# Patient Record
Sex: Female | Born: 1980 | Race: Black or African American | Hispanic: No | Marital: Married | Smoking: Never smoker
Health system: Southern US, Community
[De-identification: ages and names within clinical notes are randomized; demographics above are authoritative.]

## PROBLEM LIST (undated history)

## (undated) DIAGNOSIS — D649 Anemia, unspecified: Secondary | ICD-10-CM

## (undated) DIAGNOSIS — R112 Nausea with vomiting, unspecified: Secondary | ICD-10-CM

## (undated) DIAGNOSIS — D573 Sickle-cell trait: Secondary | ICD-10-CM

## (undated) DIAGNOSIS — N809 Endometriosis, unspecified: Secondary | ICD-10-CM

## (undated) DIAGNOSIS — T8859XA Other complications of anesthesia, initial encounter: Secondary | ICD-10-CM

## (undated) DIAGNOSIS — I10 Essential (primary) hypertension: Secondary | ICD-10-CM

## (undated) DIAGNOSIS — Z9889 Other specified postprocedural states: Secondary | ICD-10-CM

## (undated) HISTORY — PX: ABDOMINAL HYSTERECTOMY: SHX81

## (undated) HISTORY — PX: HERNIA REPAIR: SHX51

## (undated) HISTORY — PX: AUGMENTATION MAMMAPLASTY: SUR837

---

## 1998-11-13 ENCOUNTER — Other Ambulatory Visit: Admission: RE | Admit: 1998-11-13 | Discharge: 1998-11-13 | Payer: Self-pay | Admitting: *Deleted

## 1999-06-03 ENCOUNTER — Other Ambulatory Visit: Admission: RE | Admit: 1999-06-03 | Discharge: 1999-06-03 | Payer: Self-pay | Admitting: Emergency Medicine

## 1999-08-20 ENCOUNTER — Emergency Department (HOSPITAL_COMMUNITY): Admission: EM | Admit: 1999-08-20 | Discharge: 1999-08-20 | Payer: Self-pay | Admitting: Emergency Medicine

## 1999-08-20 ENCOUNTER — Encounter: Payer: Self-pay | Admitting: Emergency Medicine

## 1999-09-19 ENCOUNTER — Other Ambulatory Visit: Admission: RE | Admit: 1999-09-19 | Discharge: 1999-09-19 | Payer: Self-pay | Admitting: Gynecology

## 1999-11-13 ENCOUNTER — Other Ambulatory Visit: Admission: RE | Admit: 1999-11-13 | Discharge: 1999-11-13 | Payer: Self-pay | Admitting: Gynecology

## 2000-05-24 ENCOUNTER — Other Ambulatory Visit: Admission: RE | Admit: 2000-05-24 | Discharge: 2000-05-24 | Payer: Self-pay | Admitting: Gynecology

## 2000-11-11 ENCOUNTER — Other Ambulatory Visit: Admission: RE | Admit: 2000-11-11 | Discharge: 2000-11-11 | Payer: Self-pay | Admitting: Gynecology

## 2001-06-14 ENCOUNTER — Other Ambulatory Visit: Admission: RE | Admit: 2001-06-14 | Discharge: 2001-06-14 | Payer: Self-pay | Admitting: Gynecology

## 2002-08-28 ENCOUNTER — Other Ambulatory Visit: Admission: RE | Admit: 2002-08-28 | Discharge: 2002-08-28 | Payer: Self-pay | Admitting: Obstetrics and Gynecology

## 2003-11-09 ENCOUNTER — Emergency Department (HOSPITAL_COMMUNITY): Admission: EM | Admit: 2003-11-09 | Discharge: 2003-11-09 | Payer: Self-pay | Admitting: Family Medicine

## 2003-11-16 ENCOUNTER — Inpatient Hospital Stay (HOSPITAL_COMMUNITY): Admission: AD | Admit: 2003-11-16 | Discharge: 2003-11-16 | Payer: Self-pay | Admitting: Obstetrics & Gynecology

## 2004-02-19 ENCOUNTER — Other Ambulatory Visit: Admission: RE | Admit: 2004-02-19 | Discharge: 2004-02-19 | Payer: Self-pay | Admitting: Obstetrics and Gynecology

## 2005-07-13 ENCOUNTER — Other Ambulatory Visit: Admission: RE | Admit: 2005-07-13 | Discharge: 2005-07-13 | Payer: Self-pay | Admitting: Obstetrics & Gynecology

## 2006-03-07 ENCOUNTER — Emergency Department (HOSPITAL_COMMUNITY): Admission: EM | Admit: 2006-03-07 | Discharge: 2006-03-07 | Payer: Self-pay | Admitting: Family Medicine

## 2006-06-07 ENCOUNTER — Other Ambulatory Visit: Admission: RE | Admit: 2006-06-07 | Discharge: 2006-06-07 | Payer: Self-pay | Admitting: Obstetrics and Gynecology

## 2008-02-08 ENCOUNTER — Encounter: Admission: RE | Admit: 2008-02-08 | Discharge: 2008-02-08 | Payer: Self-pay | Admitting: Family Medicine

## 2008-07-08 ENCOUNTER — Inpatient Hospital Stay (HOSPITAL_COMMUNITY): Admission: AD | Admit: 2008-07-08 | Discharge: 2008-07-08 | Payer: Self-pay | Admitting: Obstetrics & Gynecology

## 2008-10-12 ENCOUNTER — Inpatient Hospital Stay (HOSPITAL_COMMUNITY): Admission: AD | Admit: 2008-10-12 | Discharge: 2008-10-13 | Payer: Self-pay | Admitting: Obstetrics and Gynecology

## 2009-01-27 ENCOUNTER — Emergency Department (HOSPITAL_COMMUNITY): Admission: EM | Admit: 2009-01-27 | Discharge: 2009-01-27 | Payer: Self-pay | Admitting: Emergency Medicine

## 2009-02-08 ENCOUNTER — Inpatient Hospital Stay (HOSPITAL_COMMUNITY): Admission: RE | Admit: 2009-02-08 | Discharge: 2009-02-11 | Payer: Self-pay | Admitting: Obstetrics and Gynecology

## 2009-05-04 HISTORY — PX: AUGMENTATION MAMMAPLASTY: SUR837

## 2009-07-15 ENCOUNTER — Encounter: Admission: RE | Admit: 2009-07-15 | Discharge: 2009-07-22 | Payer: Self-pay | Admitting: *Deleted

## 2009-07-31 ENCOUNTER — Emergency Department (HOSPITAL_COMMUNITY): Admission: EM | Admit: 2009-07-31 | Discharge: 2009-08-01 | Payer: Self-pay | Admitting: Emergency Medicine

## 2009-08-01 ENCOUNTER — Ambulatory Visit (HOSPITAL_COMMUNITY): Admission: RE | Admit: 2009-08-01 | Discharge: 2009-08-01 | Payer: Self-pay | Admitting: Emergency Medicine

## 2009-08-01 ENCOUNTER — Ambulatory Visit: Payer: Self-pay | Admitting: Surgery

## 2009-08-01 ENCOUNTER — Encounter (INDEPENDENT_AMBULATORY_CARE_PROVIDER_SITE_OTHER): Payer: Self-pay | Admitting: Emergency Medicine

## 2010-02-02 ENCOUNTER — Emergency Department (HOSPITAL_COMMUNITY)
Admission: EM | Admit: 2010-02-02 | Discharge: 2010-02-02 | Payer: Self-pay | Source: Home / Self Care | Admitting: Family Medicine

## 2010-04-01 ENCOUNTER — Encounter: Admission: RE | Admit: 2010-04-01 | Discharge: 2010-04-01 | Payer: Self-pay | Admitting: Family Medicine

## 2010-04-03 HISTORY — PX: OTHER SURGICAL HISTORY: SHX169

## 2010-05-05 ENCOUNTER — Ambulatory Visit (HOSPITAL_BASED_OUTPATIENT_CLINIC_OR_DEPARTMENT_OTHER): Admission: RE | Admit: 2010-05-05 | Payer: Self-pay | Admitting: Specialist

## 2010-07-27 LAB — CBC
HCT: 41.7 % (ref 36.0–46.0)
Hemoglobin: 14.1 g/dL (ref 12.0–15.0)
MCHC: 33.8 g/dL (ref 30.0–36.0)
MCV: 86.7 fL (ref 78.0–100.0)
Platelets: 260 10*3/uL (ref 150–400)
RBC: 4.81 MIL/uL (ref 3.87–5.11)
RDW: 12.5 % (ref 11.5–15.5)
WBC: 6.8 10*3/uL (ref 4.0–10.5)

## 2010-07-27 LAB — URINALYSIS, ROUTINE W REFLEX MICROSCOPIC
Bilirubin Urine: NEGATIVE
Hgb urine dipstick: NEGATIVE
Nitrite: NEGATIVE
Specific Gravity, Urine: 1.018 (ref 1.005–1.030)
pH: 6 (ref 5.0–8.0)

## 2010-07-27 LAB — POCT I-STAT, CHEM 8
Calcium, Ion: 1.23 mmol/L (ref 1.12–1.32)
Creatinine, Ser: 0.9 mg/dL (ref 0.4–1.2)
Glucose, Bld: 94 mg/dL (ref 70–99)
HCT: 45 % (ref 36.0–46.0)
Hemoglobin: 15.3 g/dL — ABNORMAL HIGH (ref 12.0–15.0)

## 2010-07-27 LAB — PROTIME-INR
INR: 0.97 (ref 0.00–1.49)
Prothrombin Time: 12.8 seconds (ref 11.6–15.2)

## 2010-07-27 LAB — DIFFERENTIAL
Basophils Relative: 1 % (ref 0–1)
Eosinophils Absolute: 0.1 10*3/uL (ref 0.0–0.7)
Neutrophils Relative %: 41 % — ABNORMAL LOW (ref 43–77)

## 2010-07-27 LAB — POCT PREGNANCY, URINE: Preg Test, Ur: NEGATIVE

## 2010-07-27 LAB — POCT CARDIAC MARKERS: Troponin i, poc: <0.05 ng/mL (ref 0.00–0.09)

## 2010-08-07 LAB — CBC
Hemoglobin: 11.6 g/dL — ABNORMAL LOW (ref 12.0–15.0)
MCHC: 33.5 g/dL (ref 30.0–36.0)
MCV: 88.2 fL (ref 78.0–100.0)
Platelets: 174 10*3/uL (ref 150–400)
Platelets: 210 10*3/uL (ref 150–400)
RBC: 4.35 MIL/uL (ref 3.87–5.11)
RDW: 14 % (ref 11.5–15.5)
WBC: 7.9 10*3/uL (ref 4.0–10.5)

## 2010-08-07 LAB — TYPE AND SCREEN: Antibody Screen: NEGATIVE

## 2010-08-07 LAB — RPR: RPR Ser Ql: NONREACTIVE

## 2010-08-07 LAB — ABO/RH: ABO/RH(D): O POS

## 2010-08-11 LAB — URINALYSIS, ROUTINE W REFLEX MICROSCOPIC
Nitrite: NEGATIVE
Protein, ur: NEGATIVE mg/dL
Specific Gravity, Urine: 1.02 (ref 1.005–1.030)
Urobilinogen, UA: 0.2 mg/dL (ref 0.0–1.0)

## 2010-08-14 LAB — URINALYSIS, ROUTINE W REFLEX MICROSCOPIC
Glucose, UA: NEGATIVE mg/dL
Ketones, ur: NEGATIVE mg/dL
Protein, ur: NEGATIVE mg/dL
Urobilinogen, UA: 0.2 mg/dL (ref 0.0–1.0)

## 2010-08-14 LAB — WET PREP, GENITAL: Yeast Wet Prep HPF POC: NONE SEEN

## 2011-01-20 ENCOUNTER — Inpatient Hospital Stay (INDEPENDENT_AMBULATORY_CARE_PROVIDER_SITE_OTHER)
Admission: RE | Admit: 2011-01-20 | Discharge: 2011-01-20 | Disposition: A | Discharge status: Home / Self Care | Payer: 59 | Source: Ambulatory Visit | Attending: Emergency Medicine | Admitting: Emergency Medicine

## 2011-01-20 DIAGNOSIS — IMO0002 Reserved for concepts with insufficient information to code with codable children: Secondary | ICD-10-CM

## 2011-05-14 ENCOUNTER — Encounter (INDEPENDENT_AMBULATORY_CARE_PROVIDER_SITE_OTHER): Payer: Self-pay

## 2011-05-19 ENCOUNTER — Encounter (INDEPENDENT_AMBULATORY_CARE_PROVIDER_SITE_OTHER): Payer: Self-pay | Admitting: General Surgery

## 2011-05-19 ENCOUNTER — Ambulatory Visit (INDEPENDENT_AMBULATORY_CARE_PROVIDER_SITE_OTHER): Payer: 59 | Admitting: General Surgery

## 2011-05-19 VITALS — BP 116/76 | HR 78 | Temp 98.5°F | Ht 66.5 in | Wt 156.0 lb

## 2011-05-19 DIAGNOSIS — R109 Unspecified abdominal pain: Secondary | ICD-10-CM

## 2011-05-19 NOTE — Progress Notes (Signed)
HPI The patient comes in complaining of pain in the right abdominal wall where she had a previous abdominal plasty and hernia repair  PE On examination she has no evidence of hernia on the right side there  Studiy review Currently there are no studies to review but that may suggest a CT scan if her pain should return  Assessment Right abdominal wall strain with no evidence of hernia  Plan See her again in the future if she should have pain and try to see her during the time she symptomatic

## 2011-06-16 ENCOUNTER — Ambulatory Visit (INDEPENDENT_AMBULATORY_CARE_PROVIDER_SITE_OTHER): Payer: 59 | Admitting: General Surgery

## 2011-06-22 ENCOUNTER — Ambulatory Visit: Payer: 59 | Attending: Family Medicine

## 2011-06-22 DIAGNOSIS — IMO0001 Reserved for inherently not codable concepts without codable children: Secondary | ICD-10-CM | POA: Insufficient documentation

## 2011-06-22 DIAGNOSIS — M545 Low back pain, unspecified: Secondary | ICD-10-CM | POA: Insufficient documentation

## 2011-06-22 DIAGNOSIS — R5381 Other malaise: Secondary | ICD-10-CM | POA: Insufficient documentation

## 2011-06-26 ENCOUNTER — Ambulatory Visit: Payer: 59 | Admitting: Physical Therapy

## 2011-07-09 ENCOUNTER — Ambulatory Visit: Payer: 59 | Attending: Family Medicine | Admitting: Physical Therapy

## 2011-07-09 DIAGNOSIS — R5381 Other malaise: Secondary | ICD-10-CM | POA: Insufficient documentation

## 2011-07-09 DIAGNOSIS — M545 Low back pain, unspecified: Secondary | ICD-10-CM | POA: Insufficient documentation

## 2011-07-09 DIAGNOSIS — IMO0001 Reserved for inherently not codable concepts without codable children: Secondary | ICD-10-CM | POA: Insufficient documentation

## 2011-07-10 ENCOUNTER — Ambulatory Visit: Payer: 59

## 2012-01-23 ENCOUNTER — Encounter (HOSPITAL_COMMUNITY): Payer: Self-pay | Admitting: *Deleted

## 2012-01-23 ENCOUNTER — Emergency Department (HOSPITAL_COMMUNITY)
Admission: EM | Admit: 2012-01-23 | Discharge: 2012-01-23 | Disposition: A | Discharge status: Home / Self Care | Payer: 59 | Attending: Emergency Medicine | Admitting: Emergency Medicine

## 2012-01-23 DIAGNOSIS — Z91040 Latex allergy status: Secondary | ICD-10-CM | POA: Insufficient documentation

## 2012-01-23 DIAGNOSIS — M62838 Other muscle spasm: Secondary | ICD-10-CM | POA: Insufficient documentation

## 2012-01-23 DIAGNOSIS — R209 Unspecified disturbances of skin sensation: Secondary | ICD-10-CM | POA: Insufficient documentation

## 2012-01-23 MED ORDER — IBUPROFEN 400 MG PO TABS
800.0000 mg | ORAL_TABLET | Freq: Once | ORAL | Status: AC
  Administered 2012-01-23: 800 mg via ORAL
  Filled 2012-01-23: qty 2

## 2012-01-23 NOTE — ED Notes (Signed)
Patient states she had onset of pain behind her both ears that burned,  The pain then moved into her face/jaw on the rights side.  She states she is also having numbness in her face on the right side.  She states the sx feel like pins and needles.  No noted weakness on exam.  She denies headache

## 2012-01-23 NOTE — ED Notes (Signed)
PT reports while at work this Am 0530 she had a sudden onset of RTrm numbness and lt neck numbness. Pt also reported sharp stabbing pain in RT eye. ANd a HA. PT reports she still has numbness in RT arm. PT went home and tried to rest but was unable rest. PT then came to ED for eval.

## 2012-01-23 NOTE — ED Provider Notes (Signed)
History  This chart was scribed for Cyndra Numbers, MD by Shari Heritage. The patient was seen in room TR09C/TR09C. Patient's care was started at 1137.     CSN: [id number]  Arrival date & time 01/23/12  1000   First MD Initiated Contact with Patient 01/23/12 1137      Chief Complaint  Patient presents with  . Otalgia  . Facial Pain  . Numbness    The history is provided by the patient. No language interpreter was used.    Carrie Maynard is a 31 y.o. female who presents to the Emergency Department complaining of a several minute long episode of moderate to severe, burning pain behind her ears that radiated down to her jaw on the right side onset 6 hours ago when she was at work. Patient also says that her jaw locked up and she felt numbness on the right side of her face. She states that the numbness in her face is resolved, but there is numbness and tingling in her right arm. Patient denies chest pain, HA, dental pain, nausea, vomiting or rashes. She says that she hasn't been under any added stress. She has no significant past medical history. Patient hasn't taken any medications at home.   Patient works at [employer].  History reviewed. No pertinent past medical history.  Past Surgical History  Procedure Date  . Hernia 04/2010  . Tummy tuck 04/2010  . Hernia repair   . Cesarean section     No family history on file.  History  Substance Use Topics  . Smoking status: Never Smoker   . Smokeless tobacco: Not on file  . Alcohol Use: No    OB History    Grav Para Term Preterm Abortions TAB SAB Ect Mult Living                  Review of Systems  HENT: Negative for dental problem.   Gastrointestinal: Negative for diarrhea and rectal pain.  Musculoskeletal: Positive for myalgias.  Skin: Negative for rash.  All other systems reviewed and are negative.    Allergies  Latex  Home Medications  No current outpatient prescriptions on file.  BP 135/81  Pulse 88  Resp 20   Ht 5\' 6"  (1.676 m)  Wt 152 lb (68.947 kg)  BMI 24.53 kg/m2  SpO2 99%  Physical Exam  GEN: Well-developed, well-nourished female56 in no acute distress HEENT: Atraumatic, normocephalic. Oropharynx clear without erythema EYES: PERRLA BL. EOM movements intact. NECK: Trachea midline, no meningismus CV: regular rate and rhythm. No murmurs, rubs, or gallops PULM: No respiratory distress.  No crackles, wheezes, or rales. GI: soft, non-tender. No guarding, rebound, or tenderness. + bowel sounds  Neuro: cranial nerves grossly 2-12 intact, no abnormalities of strength or sensation, A and O x 3 MSK: Patient moves all 4 extremities symmetrically, no deformity, edema, or injury noted, Palpable muscular tension noted in the right trapezius which patient notes causes the pain she felt earlier Skin: No rashes petechiae, purpura, or jaundice Psych: no abnormality of mood    ED Course  Procedures (including critical care time) DIAGNOSTIC STUDIES: Oxygen Saturation is 99% on room air, normal by my interpretation.    COORDINATION OF CARE: 11:53am- Patient informed of current plan for treatment and evaluation and agrees with plan at this time.      Labs Reviewed - No data to display No results found.   1. Cervical paraspinal muscle spasm   2. Trapezius muscle spasm  MDM  Patient presentation was most consistent with possible cervical muscle spasm and.  Patient had signs concerning today for intracranial process including CVA.  Patient described increased sensation rather than numbness to me.  Patient has history of shingles but reports this feels differently.  Patient was offered valium for spasm but declined and preferred to take ibuprofen at home.  Patient was given reasons to return and was discharged in good condition.      I personally performed the services described in this documentation, which was scribed in my presence. The recorded information has been reviewed and  considered.     Cyndra Numbers, MD 01/23/12 2106

## 2012-03-02 ENCOUNTER — Other Ambulatory Visit: Payer: Self-pay | Admitting: Family Medicine

## 2012-03-02 DIAGNOSIS — N644 Mastodynia: Secondary | ICD-10-CM

## 2012-03-10 ENCOUNTER — Other Ambulatory Visit: Payer: Self-pay | Admitting: Family Medicine

## 2012-03-10 ENCOUNTER — Ambulatory Visit
Admission: RE | Admit: 2012-03-10 | Discharge: 2012-03-10 | Disposition: A | Discharge status: Home / Self Care | Payer: 59 | Source: Ambulatory Visit | Attending: Family Medicine | Admitting: Family Medicine

## 2012-03-10 DIAGNOSIS — N644 Mastodynia: Secondary | ICD-10-CM

## 2012-06-07 ENCOUNTER — Encounter (INDEPENDENT_AMBULATORY_CARE_PROVIDER_SITE_OTHER): Payer: Self-pay | Admitting: General Surgery

## 2012-06-07 ENCOUNTER — Ambulatory Visit (INDEPENDENT_AMBULATORY_CARE_PROVIDER_SITE_OTHER): Payer: Commercial Managed Care - PPO | Admitting: General Surgery

## 2012-06-07 VITALS — BP 124/80 | HR 72 | Temp 97.9°F | Resp 16 | Ht 66.5 in | Wt 159.6 lb

## 2012-06-07 DIAGNOSIS — R109 Unspecified abdominal pain: Secondary | ICD-10-CM

## 2012-06-07 NOTE — Progress Notes (Signed)
The patient continues to have increasing left-sided abdominal pain although on examination there is no discernible hernia.  On examination there is no palpable hernia with bowel soft straining. I recommended possibly getting a CT scan of the abdomen and pelvis with contrast on her last visit she continues to be symptomatic. Because she is symptomatic we will go ahead and get the CT scan. I will see the patient back in one month.

## 2012-06-10 ENCOUNTER — Ambulatory Visit
Admission: RE | Admit: 2012-06-10 | Discharge: 2012-06-10 | Disposition: A | Discharge status: Home / Self Care | Payer: 59 | Source: Ambulatory Visit | Attending: General Surgery | Admitting: General Surgery

## 2012-06-10 DIAGNOSIS — R109 Unspecified abdominal pain: Secondary | ICD-10-CM

## 2012-06-10 MED ORDER — IOHEXOL 300 MG/ML  SOLN
100.0000 mL | Freq: Once | INTRAMUSCULAR | Status: AC | PRN
  Administered 2012-06-10: 100 mL via INTRAVENOUS

## 2012-06-18 ENCOUNTER — Other Ambulatory Visit: Payer: Self-pay

## 2012-07-05 ENCOUNTER — Encounter (INDEPENDENT_AMBULATORY_CARE_PROVIDER_SITE_OTHER): Payer: Commercial Managed Care - PPO | Admitting: General Surgery

## 2012-07-19 ENCOUNTER — Encounter (INDEPENDENT_AMBULATORY_CARE_PROVIDER_SITE_OTHER): Payer: Commercial Managed Care - PPO | Admitting: General Surgery

## 2013-03-09 ENCOUNTER — Other Ambulatory Visit: Payer: Self-pay

## 2013-04-03 ENCOUNTER — Emergency Department (HOSPITAL_COMMUNITY)
Admission: EM | Admit: 2013-04-03 | Discharge: 2013-04-03 | Disposition: A | Discharge status: Home / Self Care | Payer: 59 | Attending: Emergency Medicine | Admitting: Emergency Medicine

## 2013-04-03 ENCOUNTER — Encounter (HOSPITAL_COMMUNITY): Payer: Self-pay | Admitting: Emergency Medicine

## 2013-04-03 DIAGNOSIS — Y929 Unspecified place or not applicable: Secondary | ICD-10-CM | POA: Insufficient documentation

## 2013-04-03 DIAGNOSIS — D649 Anemia, unspecified: Secondary | ICD-10-CM | POA: Insufficient documentation

## 2013-04-03 DIAGNOSIS — R350 Frequency of micturition: Secondary | ICD-10-CM | POA: Insufficient documentation

## 2013-04-03 DIAGNOSIS — R109 Unspecified abdominal pain: Secondary | ICD-10-CM | POA: Insufficient documentation

## 2013-04-03 DIAGNOSIS — T148XXA Other injury of unspecified body region, initial encounter: Secondary | ICD-10-CM

## 2013-04-03 DIAGNOSIS — X58XXXA Exposure to other specified factors, initial encounter: Secondary | ICD-10-CM | POA: Insufficient documentation

## 2013-04-03 DIAGNOSIS — Z8719 Personal history of other diseases of the digestive system: Secondary | ICD-10-CM | POA: Insufficient documentation

## 2013-04-03 DIAGNOSIS — Z91040 Latex allergy status: Secondary | ICD-10-CM | POA: Insufficient documentation

## 2013-04-03 DIAGNOSIS — Y939 Activity, unspecified: Secondary | ICD-10-CM | POA: Insufficient documentation

## 2013-04-03 DIAGNOSIS — Z3202 Encounter for pregnancy test, result negative: Secondary | ICD-10-CM | POA: Insufficient documentation

## 2013-04-03 DIAGNOSIS — S335XXA Sprain of ligaments of lumbar spine, initial encounter: Secondary | ICD-10-CM | POA: Insufficient documentation

## 2013-04-03 LAB — CBC WITH DIFFERENTIAL/PLATELET
Basophils Absolute: 0.1 10*3/uL (ref 0.0–0.1)
Basophils Relative: 2 % — ABNORMAL HIGH (ref 0–1)
Eosinophils Absolute: 0.1 10*3/uL (ref 0.0–0.7)
HCT: 34.1 % — ABNORMAL LOW (ref 36.0–46.0)
Hemoglobin: 11.8 g/dL — ABNORMAL LOW (ref 12.0–15.0)
Lymphs Abs: 1.9 10*3/uL (ref 0.7–4.0)
MCH: 26.6 pg (ref 26.0–34.0)
MCHC: 34.6 g/dL (ref 30.0–36.0)
Monocytes Absolute: 0.5 10*3/uL (ref 0.1–1.0)
Monocytes Relative: 11 % (ref 3–12)
Neutrophils Relative %: 44 % (ref 43–77)
RBC: 4.43 MIL/uL (ref 3.87–5.11)
RDW: 13.9 % (ref 11.5–15.5)
WBC: 4.5 10*3/uL (ref 4.0–10.5)

## 2013-04-03 LAB — BASIC METABOLIC PANEL
BUN: 10 mg/dL (ref 6–23)
Calcium: 8.8 mg/dL (ref 8.4–10.5)
Chloride: 107 mEq/L (ref 96–112)
Creatinine, Ser: 0.83 mg/dL (ref 0.50–1.10)
GFR calc Af Amer: >90 mL/min (ref >90)
GFR calc non Af Amer: >90 mL/min (ref >90)
Glucose, Bld: 95 mg/dL (ref 70–99)
Potassium: 4.2 mEq/L (ref 3.5–5.1)
Sodium: 139 mEq/L (ref 135–145)

## 2013-04-03 LAB — URINALYSIS, ROUTINE W REFLEX MICROSCOPIC
Bilirubin Urine: NEGATIVE
Glucose, UA: NEGATIVE mg/dL
Ketones, ur: NEGATIVE mg/dL
Leukocytes, UA: NEGATIVE
Nitrite: NEGATIVE
Specific Gravity, Urine: 1.016 (ref 1.005–1.030)
pH: 5.5 (ref 5.0–8.0)

## 2013-04-03 MED ORDER — KETOROLAC TROMETHAMINE 30 MG/ML IJ SOLN
30.0000 mg | Freq: Once | INTRAMUSCULAR | Status: AC
  Administered 2013-04-03: 30 mg via INTRAMUSCULAR
  Filled 2013-04-03: qty 1

## 2013-04-03 NOTE — ED Provider Notes (Signed)
CSN: [id number]     Arrival date & time 04/03/13  0254 History   First MD Initiated Contact with Patient 04/03/13 0355     Chief Complaint  Patient presents with  . Flank Pain  . Urinary Frequency   HPI  History provided by the patient. Patient is a 32 year old female with previous history of cesarean sections in hernia repairs who presents with complaints of left flank and lower abdominal pain and discomfort. Patient reports first having some increased urinary frequency and lower bladder pressure that began on Saturday. This progressed over the weekend into some left flank pain and discomfort that is worse with movements and bending. She denies having any injury or trauma. 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 Patient states she is concerned for possible UTI. She denies any menstrual changes. Denies any vaginal bleeding or vaginal discharge. No associated fever, chills or sweats. No nausea, vomiting, diarrhea or constipation.    History reviewed. No pertinent past medical history. Past Surgical History  Procedure Laterality Date  . Hernia  04/2010  . Tummy tuck  04/2010  . Hernia repair    . Cesarean section     No family history on file. History  Substance Use Topics  . Smoking status: Never Smoker   . Smokeless tobacco: Not on file  . Alcohol Use: No   OB History   Grav Para Term Preterm Abortions TAB SAB Ect Mult Living                 Review of Systems  Constitutional: Negative for fever, chills and diaphoresis.  Respiratory: Negative for shortness of breath.   Cardiovascular: Negative for chest pain.  Gastrointestinal: Positive for abdominal pain. Negative for nausea, vomiting, diarrhea and constipation.  Genitourinary: Positive for frequency and flank pain. Negative for dysuria, hematuria, vaginal bleeding, vaginal discharge and menstrual problem.  All other systems reviewed and are negative.    Allergies  Latex  Home Medications   Current Outpatient Rx   Name  Route  Sig  Dispense  Refill  . APPLE CIDER VINEGAR PO   Oral   Take 2 tablets by mouth daily.         Marland Kitchen CRANBERRY PO   Oral   Take 2 tablets by mouth 2 (two) times daily.          BP 135/92  Pulse 103  Temp(Src) 98 F (36.7 C) (Oral)  Resp 14  Ht 5\' 6"  (1.676 m)  Wt 164 lb (74.39 kg)  BMI 26.48 kg/m2  SpO2 99%  LMP 03/22/2013 Physical Exam  Nursing note and vitals reviewed. Constitutional: She is oriented to person, place, and time. She appears well-developed and well-nourished. No distress.  HENT:  Head: Normocephalic.  Cardiovascular: Normal rate and regular rhythm.   Pulmonary/Chest: Effort normal and breath sounds normal. No respiratory distress. She has no wheezes. She has no rales.  Abdominal: Soft. There is tenderness in the suprapubic area and left lower quadrant. There is no rebound, no guarding, no CVA tenderness, no tenderness at McBurney's point and negative Murphy's sign.  Musculoskeletal: Normal range of motion. She exhibits no edema and no tenderness.       Lumbar back: She exhibits no bony tenderness.       Back:  Neurological: She is alert and oriented to person, place, and time.  Skin: Skin is warm and dry. No rash noted.  Psychiatric: She has a normal mood and affect. Her behavior is normal.  ED Course  Procedures   DIAGNOSTIC STUDIES: Oxygen Saturation is 99% on room air.    COORDINATION OF CARE:  Nursing notes reviewed. Vital signs reviewed. Initial pt interview and examination performed.   4:15 AM patient seen and evaluated. Patient appears well no acute distress. Patient does have slight suprapubic and left lower abdominal tenderness very mild. There is also pain to palpation over the left lower back and flank area. No deformities. No sniff and spasm of the muscle. Discussed work up plan with pt at bedside, which includes basic lab testing and UA. Pt agrees with plan. I also discussed options for a pelvic examination to determine if  symptoms could be gynecological in nature. Patient refused to have a pelvic examination and prefers to followup with her OB/GYN.  Labs are unremarkable. Normal urine. Negative pregnancy. At this time there is no significant findings to suggest an emergent or concerning cause for her symptoms. Patient does have significant improvement of report all. At this time she prefers to return home and followup with her doctors. Strict return precautions given.   Treatment plan initiated: Medications  ketorolac (TORADOL) 30 MG/ML injection 30 mg (30 mg Intramuscular Given 04/03/13 0448)     Results for orders placed during the hospital encounter of 04/03/13  URINALYSIS, ROUTINE W REFLEX MICROSCOPIC      Result Value Range   Color, Urine YELLOW  YELLOW   APPearance CLEAR  CLEAR   Specific Gravity, Urine 1.016  1.005 - 1.030   pH 5.5  5.0 - 8.0   Glucose, UA NEGATIVE  NEGATIVE mg/dL   Hgb urine dipstick NEGATIVE  NEGATIVE   Bilirubin Urine NEGATIVE  NEGATIVE   Ketones, ur NEGATIVE  NEGATIVE mg/dL   Protein, ur NEGATIVE  NEGATIVE mg/dL   Urobilinogen, UA 0.2  0.0 - 1.0 mg/dL   Nitrite NEGATIVE  NEGATIVE   Leukocytes, UA NEGATIVE  NEGATIVE  CBC WITH DIFFERENTIAL      Result Value Range   WBC 4.5  4.0 - 10.5 K/uL   RBC 4.43  3.87 - 5.11 MIL/uL   Hemoglobin 11.8 (*) 12.0 - 15.0 g/dL   HCT 16.1 (*) 09.6 - 04.5 %   MCV 77.0 (*) 78.0 - 100.0 fL   MCH 26.6  26.0 - 34.0 pg   MCHC 34.6  30.0 - 36.0 g/dL   RDW 40.9  81.1 - 91.4 %   Platelets 249  150 - 400 K/uL   Neutrophils Relative % 44  43 - 77 %   Neutro Abs 2.0  1.7 - 7.7 K/uL   Lymphocytes Relative 43  12 - 46 %   Lymphs Abs 1.9  0.7 - 4.0 K/uL   Monocytes Relative 11  3 - 12 %   Monocytes Absolute 0.5  0.1 - 1.0 K/uL   Eosinophils Relative 1  0 - 5 %   Eosinophils Absolute 0.1  0.0 - 0.7 K/uL   Basophils Relative 2 (*) 0 - 1 %   Basophils Absolute 0.1  0.0 - 0.1 K/uL  BASIC METABOLIC PANEL      Result Value Range   Sodium 139  135 -  145 mEq/L   Potassium 4.2  3.5 - 5.1 mEq/L   Chloride 107  96 - 112 mEq/L   CO2 26  19 - 32 mEq/L   Glucose, Bld 95  70 - 99 mg/dL   BUN 10  6 - 23 mg/dL   Creatinine, Ser 7.82  0.50 - 1.10 mg/dL  Calcium 8.8  8.4 - 10.5 mg/dL   GFR calc non Af Amer >90  >90 mL/min   GFR calc Af Amer >90  >90 mL/min  POCT PREGNANCY, URINE      Result Value Range   Preg Test, Ur NEGATIVE  NEGATIVE     MDM   1. Flank pain   2. Muscle strain   3. Anemia        Angus Seller, PA-C 04/03/13 [phone]

## 2013-04-03 NOTE — ED Notes (Signed)
Pt states that she was working 12 hours on Saturday and she noticed that her bladder felt full and her left upper leg hurt. Pt states that last night through the evening she was unable to sleep due to pain in her left kidney area. Pt states she has not had this pain before but that it is a stabbing pain that increases when she moves. Pt denies N/V/D.

## 2013-04-03 NOTE — ED Provider Notes (Signed)
Medical screening examination/treatment/procedure(s) were performed by non-physician practitioner and as supervising physician I was immediately available for consultation/collaboration.    Olivia Mackie, MD 04/03/13 [phone]

## 2013-04-03 NOTE — ED Notes (Signed)
Pt. reports left flank pain , bladder pressure , urinary frequency onset Saturday last week , denies fever or chills. No hematuria .

## 2013-09-12 ENCOUNTER — Other Ambulatory Visit: Payer: Self-pay | Admitting: Obstetrics & Gynecology

## 2013-09-12 ENCOUNTER — Other Ambulatory Visit (HOSPITAL_COMMUNITY)
Admission: RE | Admit: 2013-09-12 | Discharge: 2013-09-12 | Disposition: A | Discharge status: Home / Self Care | Payer: 59 | Source: Ambulatory Visit | Attending: Obstetrics & Gynecology | Admitting: Obstetrics & Gynecology

## 2013-09-12 DIAGNOSIS — Z1151 Encounter for screening for human papillomavirus (HPV): Secondary | ICD-10-CM | POA: Insufficient documentation

## 2013-09-12 DIAGNOSIS — Z01419 Encounter for gynecological examination (general) (routine) without abnormal findings: Secondary | ICD-10-CM | POA: Insufficient documentation

## 2013-09-21 ENCOUNTER — Other Ambulatory Visit: Payer: Self-pay | Admitting: Obstetrics & Gynecology

## 2013-09-21 DIAGNOSIS — N644 Mastodynia: Secondary | ICD-10-CM

## 2013-10-09 ENCOUNTER — Other Ambulatory Visit: Payer: 59

## 2013-12-25 ENCOUNTER — Ambulatory Visit
Admission: RE | Admit: 2013-12-25 | Discharge: 2013-12-25 | Disposition: A | Discharge status: Home / Self Care | Payer: 59 | Source: Ambulatory Visit | Attending: Obstetrics & Gynecology | Admitting: Obstetrics & Gynecology

## 2013-12-25 DIAGNOSIS — N644 Mastodynia: Secondary | ICD-10-CM

## 2015-01-24 ENCOUNTER — Other Ambulatory Visit: Payer: Self-pay | Admitting: Obstetrics & Gynecology

## 2015-05-09 MED FILL — FERROUS GLUCONATE 324 MG TA: 324 (38 FE) | 90 days supply | Qty: 180 | Fill #0

## 2015-05-14 MED FILL — TRANEXAMIC ACID 650 MG TAB: 650 | 3 days supply | Qty: 20 | Fill #1

## 2015-05-18 ENCOUNTER — Emergency Department (HOSPITAL_BASED_OUTPATIENT_CLINIC_OR_DEPARTMENT_OTHER): Payer: 59

## 2015-05-18 ENCOUNTER — Encounter (HOSPITAL_BASED_OUTPATIENT_CLINIC_OR_DEPARTMENT_OTHER): Payer: Self-pay | Admitting: *Deleted

## 2015-05-18 ENCOUNTER — Emergency Department (HOSPITAL_BASED_OUTPATIENT_CLINIC_OR_DEPARTMENT_OTHER)
Admission: EM | Admit: 2015-05-18 | Discharge: 2015-05-18 | Disposition: A | Discharge status: Home / Self Care | Payer: 59 | Attending: Emergency Medicine | Admitting: Emergency Medicine

## 2015-05-18 DIAGNOSIS — K5900 Constipation, unspecified: Secondary | ICD-10-CM | POA: Diagnosis not present

## 2015-05-18 DIAGNOSIS — Z91040 Latex allergy status: Secondary | ICD-10-CM | POA: Insufficient documentation

## 2015-05-18 DIAGNOSIS — Z79899 Other long term (current) drug therapy: Secondary | ICD-10-CM | POA: Diagnosis not present

## 2015-05-18 DIAGNOSIS — R0789 Other chest pain: Secondary | ICD-10-CM | POA: Insufficient documentation

## 2015-05-18 DIAGNOSIS — R079 Chest pain, unspecified: Secondary | ICD-10-CM | POA: Diagnosis present

## 2015-05-18 LAB — CBC WITH DIFFERENTIAL/PLATELET
Basophils Absolute: 0 10*3/uL (ref 0.0–0.1)
Basophils Relative: 0 %
EOS ABS: 0.1 10*3/uL (ref 0.0–0.7)
Eosinophils Relative: 2 %
HCT: 33.4 % — ABNORMAL LOW (ref 36.0–46.0)
HEMOGLOBIN: 11 g/dL — AB (ref 12.0–15.0)
LYMPHS ABS: 2.2 10*3/uL (ref 0.7–4.0)
Lymphocytes Relative: 39 %
MCH: 24.3 pg — AB (ref 26.0–34.0)
MCHC: 32.9 g/dL (ref 30.0–36.0)
MCV: 73.7 fL — ABNORMAL LOW (ref 78.0–100.0)
MONO ABS: 0.4 10*3/uL (ref 0.1–1.0)
MONOS PCT: 8 %
NEUTROS PCT: 51 %
Neutro Abs: 2.9 10*3/uL (ref 1.7–7.7)
PLATELETS: 271 10*3/uL (ref 150–400)
RBC: 4.53 MIL/uL (ref 3.87–5.11)
RDW: 17 % — ABNORMAL HIGH (ref 11.5–15.5)
WBC: 5.7 10*3/uL (ref 4.0–10.5)

## 2015-05-18 LAB — BASIC METABOLIC PANEL
Anion gap: 6 (ref 5–15)
BUN: 10 mg/dL (ref 6–20)
CO2: 26 mmol/L (ref 22–32)
CREATININE: 0.95 mg/dL (ref 0.44–1.00)
Calcium: 8.9 mg/dL (ref 8.9–10.3)
Chloride: 107 mmol/L (ref 101–111)
GFR calc Af Amer: >60 mL/min (ref >60)
GFR calc non Af Amer: >60 mL/min (ref >60)
GLUCOSE: 103 mg/dL — AB (ref 65–99)
Potassium: 3.9 mmol/L (ref 3.5–5.1)
SODIUM: 139 mmol/L (ref 135–145)

## 2015-05-18 LAB — TROPONIN I: Troponin I: <0.03 ng/mL (ref <0.031)

## 2015-05-18 MED ORDER — KETOROLAC TROMETHAMINE 30 MG/ML IJ SOLN
30.0000 mg | Freq: Once | INTRAMUSCULAR | Status: AC
  Administered 2015-05-18: 30 mg via INTRAVENOUS
  Filled 2015-05-18: qty 1

## 2015-05-18 NOTE — ED Notes (Signed)
Patient states she started having chest pain last night around 10 pm while working. Continuous pressure on Left side of chest , no SOB. Was informed by Primary MD last week her iron was low and started taking iron pills

## 2015-05-18 NOTE — ED Provider Notes (Signed)
CSN: [id number]     Arrival date & time 05/18/15  1019 History   First MD Initiated Contact with Patient 05/18/15 1039     Chief Complaint  Patient presents with  . Chest Pain     (Consider location/radiation/quality/duration/timing/severity/associated sxs/prior Treatment) HPI  35 year old female with a history of anemia presents with chest pain that started at 10 PM last night at work. 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 She states it is a pressure and tightness under her left breast. Has felt some palpitations/heart fluttering as well. No shortness of breath or pleuritic pain. No diaphoresis, nausea, or vomiting. Pain does not radiate. Pain has been constant since 10 PM and she was unable to sleep. No history of hypertension, hyperlipidemia, diabetes, smoking, or family history of CAD. Requests toradol for pain.  History reviewed. No pertinent past medical history. Past Surgical History  Procedure Laterality Date  . Hernia  04/2010  . Tummy tuck  04/2010  . Hernia repair    . Cesarean section     No family history on file. Social History  Substance Use Topics  . Smoking status: Never Smoker   . Smokeless tobacco: None  . Alcohol Use: Yes   OB History    No data available     Review of Systems  Constitutional: Negative for fever and diaphoresis.  Respiratory: Positive for chest tightness. Negative for shortness of breath.   Cardiovascular: Positive for chest pain. Negative for leg swelling.  Gastrointestinal: Positive for constipation. Negative for vomiting and abdominal pain.  All other systems reviewed and are negative.     Allergies  Latex  Home Medications   Prior to Admission medications   Medication Sig Start Date End Date Taking? Authorizing Provider  ferrous gluconate (FERGON) 324 MG tablet Take 324 mg by mouth daily with breakfast.   Yes Historical Provider, MD  APPLE CIDER VINEGAR PO Take 2 tablets  by mouth daily.    Historical Provider, MD  CRANBERRY PO Take 2 tablets by mouth 2 (two) times daily.    Historical Provider, MD   There were no vitals taken for this visit. Physical Exam  Constitutional: She is oriented to person, place, and time. She appears well-developed and well-nourished. No distress.  HENT:  Head: Normocephalic and atraumatic.  Right Ear: External ear normal.  Left Ear: External ear normal.  Nose: Nose normal.  Eyes: Right eye exhibits no discharge. Left eye exhibits no discharge.  Cardiovascular: Normal rate, regular rhythm and normal heart sounds.   Pulmonary/Chest: Effort normal and breath sounds normal. She exhibits tenderness (mild).    Abdominal: Soft. There is no tenderness.  Musculoskeletal: She exhibits no edema.  Neurological: She is alert and oriented to person, place, and time.  Skin: Skin is warm and dry. She is not diaphoretic.  Nursing note and vitals reviewed.   ED Course  Procedures (including critical care time) Labs Review Labs Reviewed  BASIC METABOLIC PANEL - Abnormal; Notable for the following:    Glucose, Bld 103 (*)    All other components within normal limits  CBC WITH DIFFERENTIAL/PLATELET - Abnormal; Notable for the following:    Hemoglobin 11.0 (*)    HCT 33.4 (*)    MCV 73.7 (*)    MCH 24.3 (*)    RDW 17.0 (*)    All other components within normal limits  TROPONIN I    Imaging Review Dg Chest 2 View  05/18/2015  CLINICAL DATA:  Chest pain beginning last night impossible palpitations. EXAM: CHEST  2 VIEW COMPARISON:  None. FINDINGS: The lungs are adequately inflated without focal consolidation or effusion. Cardiomediastinal silhouette is within normal. Remaining bony structures are within normal. IMPRESSION: No active cardiopulmonary disease. Electronically Signed   By: Marin Olp M.D.   On: 05/18/2015 11:30   I have personally reviewed and evaluated these images and lab results as part of my medical  decision-making.   EKG Interpretation   Date/Time:  Saturday May 18 2015 11:25:42 EST Ventricular Rate:  79 PR Interval:  110 QRS Duration: 100 QT Interval:  389 QTC Calculation: 446 R Axis:   54 Text Interpretation:  Sinus rhythm Borderline short PR interval RSR' in V1  or V2, right VCD or RVH no significant change since 2011 Confirmed by  Lasaro Primm  MD, Olia Hinderliter ([id number]) on 05/18/2015 11:29:54 AM      MDM   Final diagnoses:  Atypical chest pain    Patient's chest pain is atypical and unlikely to be ACS. With normal vital signs and no risk factors I have low suspicion for PE. Patient is PERC negative. Patient has reproducible chest pain, normal ECG, and a negative troponin after 12+ hours of continuous symptoms. At this point will recommend NSAIDs and follow-up with her PCP. Advised to return precautions.    Sherwood Gambler, MD 05/19/15 [phone]

## 2015-06-10 DIAGNOSIS — D649 Anemia, unspecified: Secondary | ICD-10-CM | POA: Diagnosis not present

## 2015-07-04 DIAGNOSIS — J101 Influenza due to other identified influenza virus with other respiratory manifestations: Secondary | ICD-10-CM | POA: Diagnosis not present

## 2015-07-04 DIAGNOSIS — R05 Cough: Secondary | ICD-10-CM | POA: Diagnosis not present

## 2015-07-04 MED FILL — OSELTAMIVIR PHOS 75 MG CAP: 75 | 5 days supply | Qty: 10 | Fill #0

## 2015-07-04 MED FILL — CHERATUSSIN AC SYRUP: 100-10 | 4 days supply | Qty: 120 | Fill #0

## 2015-07-04 MED FILL — VENTOLIN HFA 90 MCG INHALER: 108 (90 BAS | 16 days supply | Qty: 18 | Fill #0

## 2015-08-12 DIAGNOSIS — M25561 Pain in right knee: Secondary | ICD-10-CM | POA: Diagnosis not present

## 2015-09-17 MED FILL — FERROUS GLUCONATE 324 MG TA: 324 (38 FE) | 90 days supply | Qty: 180 | Fill #1

## 2015-09-19 DIAGNOSIS — N83292 Other ovarian cyst, left side: Secondary | ICD-10-CM | POA: Diagnosis not present

## 2015-09-19 DIAGNOSIS — N939 Abnormal uterine and vaginal bleeding, unspecified: Secondary | ICD-10-CM | POA: Diagnosis not present

## 2015-09-19 DIAGNOSIS — D649 Anemia, unspecified: Secondary | ICD-10-CM | POA: Diagnosis not present

## 2015-09-19 DIAGNOSIS — Z3202 Encounter for pregnancy test, result negative: Secondary | ICD-10-CM | POA: Diagnosis not present

## 2015-10-09 DIAGNOSIS — M79652 Pain in left thigh: Secondary | ICD-10-CM | POA: Diagnosis not present

## 2015-10-09 DIAGNOSIS — M79651 Pain in right thigh: Secondary | ICD-10-CM | POA: Diagnosis not present

## 2015-10-09 DIAGNOSIS — M79605 Pain in left leg: Secondary | ICD-10-CM | POA: Diagnosis not present

## 2015-10-09 DIAGNOSIS — M79604 Pain in right leg: Secondary | ICD-10-CM | POA: Diagnosis not present

## 2015-10-21 DIAGNOSIS — M79605 Pain in left leg: Secondary | ICD-10-CM | POA: Diagnosis not present

## 2015-10-21 MED FILL — MELOXICAM 15 MG TABLET: 15 | 30 days supply | Qty: 30 | Fill #0

## 2015-10-24 DIAGNOSIS — N939 Abnormal uterine and vaginal bleeding, unspecified: Secondary | ICD-10-CM | POA: Diagnosis not present

## 2015-10-24 DIAGNOSIS — D500 Iron deficiency anemia secondary to blood loss (chronic): Secondary | ICD-10-CM | POA: Diagnosis not present

## 2015-10-24 DIAGNOSIS — Z975 Presence of (intrauterine) contraceptive device: Secondary | ICD-10-CM | POA: Diagnosis not present

## 2015-10-24 DIAGNOSIS — N946 Dysmenorrhea, unspecified: Secondary | ICD-10-CM | POA: Diagnosis not present

## 2015-10-24 DIAGNOSIS — N83292 Other ovarian cyst, left side: Secondary | ICD-10-CM | POA: Diagnosis not present

## 2015-10-30 MED FILL — NORETHIND-ETH ESTRAD 1-0.02: 1-20 | 28 days supply | Qty: 21 | Fill #0

## 2015-11-19 DIAGNOSIS — M79604 Pain in right leg: Secondary | ICD-10-CM | POA: Diagnosis not present

## 2015-11-19 DIAGNOSIS — I8312 Varicose veins of left lower extremity with inflammation: Secondary | ICD-10-CM | POA: Diagnosis not present

## 2015-11-19 DIAGNOSIS — M79651 Pain in right thigh: Secondary | ICD-10-CM | POA: Diagnosis not present

## 2015-11-19 DIAGNOSIS — I8311 Varicose veins of right lower extremity with inflammation: Secondary | ICD-10-CM | POA: Diagnosis not present

## 2016-03-05 DIAGNOSIS — M94261 Chondromalacia, right knee: Secondary | ICD-10-CM | POA: Diagnosis not present

## 2016-03-05 DIAGNOSIS — M25561 Pain in right knee: Secondary | ICD-10-CM | POA: Diagnosis not present

## 2016-03-05 DIAGNOSIS — G8929 Other chronic pain: Secondary | ICD-10-CM | POA: Diagnosis not present

## 2016-03-10 DIAGNOSIS — Z30432 Encounter for removal of intrauterine contraceptive device: Secondary | ICD-10-CM | POA: Diagnosis not present

## 2016-03-10 DIAGNOSIS — N939 Abnormal uterine and vaginal bleeding, unspecified: Secondary | ICD-10-CM | POA: Diagnosis not present

## 2016-03-10 MED FILL — INTROVALE 0.15-0.03 MG TAB: 0.15-0.03 | 90 days supply | Qty: 91 | Fill #0

## 2016-03-24 DIAGNOSIS — G8929 Other chronic pain: Secondary | ICD-10-CM | POA: Diagnosis not present

## 2016-03-24 DIAGNOSIS — M25561 Pain in right knee: Secondary | ICD-10-CM | POA: Diagnosis not present

## 2016-05-11 MED FILL — MONO-LINYAH 28 TABLET: 0.25-35 | 28 days supply | Qty: 28 | Fill #0

## 2016-06-09 DIAGNOSIS — J101 Influenza due to other identified influenza virus with other respiratory manifestations: Secondary | ICD-10-CM | POA: Diagnosis not present

## 2016-06-09 DIAGNOSIS — R05 Cough: Secondary | ICD-10-CM | POA: Diagnosis not present

## 2016-06-09 MED FILL — OSELTAMIVIR PHOSPHATE 75 MG: 75 | 5 days supply | Qty: 10 | Fill #0

## 2016-06-09 MED FILL — VENTOLIN HFA 90 MCG INHALER: 108 (90 BAS | 25 days supply | Qty: 18 | Fill #0

## 2016-06-30 DIAGNOSIS — N939 Abnormal uterine and vaginal bleeding, unspecified: Secondary | ICD-10-CM | POA: Diagnosis not present

## 2016-07-10 MED FILL — MONO-LINYAH 28 TABLET: 0.25-35 | 28 days supply | Qty: 28 | Fill #1

## 2016-08-07 MED FILL — MONO-LINYAH 28 TABLET: 0.25-35 | 28 days supply | Qty: 28 | Fill #2

## 2016-09-14 ENCOUNTER — Other Ambulatory Visit (HOSPITAL_COMMUNITY)
Admission: RE | Admit: 2016-09-14 | Discharge: 2016-09-14 | Disposition: A | Discharge status: Home / Self Care | Payer: 59 | Source: Ambulatory Visit | Attending: Obstetrics & Gynecology | Admitting: Obstetrics & Gynecology

## 2016-09-14 ENCOUNTER — Other Ambulatory Visit: Payer: Self-pay | Admitting: Obstetrics & Gynecology

## 2016-09-14 DIAGNOSIS — Z01411 Encounter for gynecological examination (general) (routine) with abnormal findings: Secondary | ICD-10-CM | POA: Diagnosis not present

## 2016-09-14 DIAGNOSIS — N939 Abnormal uterine and vaginal bleeding, unspecified: Secondary | ICD-10-CM | POA: Diagnosis not present

## 2016-09-14 DIAGNOSIS — Z124 Encounter for screening for malignant neoplasm of cervix: Secondary | ICD-10-CM | POA: Diagnosis not present

## 2016-09-16 LAB — CYTOLOGY - PAP
Adequacy: ABSENT
Diagnosis: NEGATIVE
HPV: NOT DETECTED

## 2016-12-14 DIAGNOSIS — Z23 Encounter for immunization: Secondary | ICD-10-CM | POA: Diagnosis not present

## 2016-12-15 DIAGNOSIS — D649 Anemia, unspecified: Secondary | ICD-10-CM | POA: Diagnosis not present

## 2016-12-15 DIAGNOSIS — R5383 Other fatigue: Secondary | ICD-10-CM | POA: Diagnosis not present

## 2016-12-15 DIAGNOSIS — D573 Sickle-cell trait: Secondary | ICD-10-CM | POA: Diagnosis not present

## 2016-12-15 DIAGNOSIS — Z0000 Encounter for general adult medical examination without abnormal findings: Secondary | ICD-10-CM | POA: Diagnosis not present

## 2017-01-12 DIAGNOSIS — R739 Hyperglycemia, unspecified: Secondary | ICD-10-CM | POA: Diagnosis not present

## 2017-01-26 MED FILL — FERROUS GLUCONATE 324 MG TA: 324 (38 FE) | 30 days supply | Qty: 30 | Fill #0

## 2017-02-18 DIAGNOSIS — D509 Iron deficiency anemia, unspecified: Secondary | ICD-10-CM | POA: Diagnosis not present

## 2017-03-29 ENCOUNTER — Ambulatory Visit: Payer: 59 | Admitting: Family

## 2017-03-29 ENCOUNTER — Other Ambulatory Visit: Payer: 59

## 2017-03-30 ENCOUNTER — Other Ambulatory Visit: Payer: Self-pay | Admitting: Family

## 2017-03-30 DIAGNOSIS — D649 Anemia, unspecified: Secondary | ICD-10-CM

## 2017-03-31 ENCOUNTER — Other Ambulatory Visit: Payer: Self-pay

## 2017-03-31 ENCOUNTER — Ambulatory Visit (HOSPITAL_BASED_OUTPATIENT_CLINIC_OR_DEPARTMENT_OTHER): Payer: 59 | Admitting: Family

## 2017-03-31 ENCOUNTER — Other Ambulatory Visit (HOSPITAL_BASED_OUTPATIENT_CLINIC_OR_DEPARTMENT_OTHER): Payer: 59

## 2017-03-31 ENCOUNTER — Encounter: Payer: Self-pay | Admitting: Family

## 2017-03-31 VITALS — BP 144/85 | HR 86 | Temp 97.9°F | Resp 16 | Wt 167.0 lb

## 2017-03-31 DIAGNOSIS — D500 Iron deficiency anemia secondary to blood loss (chronic): Secondary | ICD-10-CM | POA: Diagnosis not present

## 2017-03-31 DIAGNOSIS — D649 Anemia, unspecified: Secondary | ICD-10-CM

## 2017-03-31 DIAGNOSIS — N920 Excessive and frequent menstruation with regular cycle: Secondary | ICD-10-CM

## 2017-03-31 DIAGNOSIS — Z832 Family history of diseases of the blood and blood-forming organs and certain disorders involving the immune mechanism: Secondary | ICD-10-CM

## 2017-03-31 DIAGNOSIS — E559 Vitamin D deficiency, unspecified: Secondary | ICD-10-CM | POA: Diagnosis not present

## 2017-03-31 DIAGNOSIS — D510 Vitamin B12 deficiency anemia due to intrinsic factor deficiency: Secondary | ICD-10-CM | POA: Diagnosis not present

## 2017-03-31 DIAGNOSIS — D573 Sickle-cell trait: Secondary | ICD-10-CM

## 2017-03-31 LAB — CBC WITH DIFFERENTIAL (CANCER CENTER ONLY)
BASO#: 0 10*3/uL (ref 0.0–0.2)
BASO%: 0.7 % (ref 0.0–2.0)
EOS%: 2.9 % (ref 0.0–7.0)
Eosinophils Absolute: 0.1 10*3/uL (ref 0.0–0.5)
HEMATOCRIT: 36.4 % (ref 34.8–46.6)
HEMOGLOBIN: 12.5 g/dL (ref 11.6–15.9)
LYMPH#: 1.9 10*3/uL (ref 0.9–3.3)
LYMPH%: 42.2 % (ref 14.0–48.0)
MCH: 27.4 pg (ref 26.0–34.0)
MCHC: 34.3 g/dL (ref 32.0–36.0)
MCV: 80 fL — ABNORMAL LOW (ref 81–101)
MONO#: 0.4 10*3/uL (ref 0.1–0.9)
MONO%: 9.7 % (ref 0.0–13.0)
NEUT%: 44.5 % (ref 39.6–80.0)
NEUTROS ABS: 2 10*3/uL (ref 1.5–6.5)
PLATELETS: 245 10*3/uL (ref 145–400)
RBC: 4.57 10*6/uL (ref 3.70–5.32)
RDW: 14.4 % (ref 11.1–15.7)
WBC: 4.6 10*3/uL (ref 3.9–10.0)

## 2017-03-31 LAB — IRON AND TIBC
%SAT: 10 % — ABNORMAL LOW (ref 21–57)
Iron: 33 ug/dL — ABNORMAL LOW (ref 41–142)
TIBC: 319 ug/dL (ref 236–444)
UIBC: 286 ug/dL (ref 120–384)

## 2017-03-31 LAB — CMP (CANCER CENTER ONLY)
ALT(SGPT): 20 U/L (ref 10–47)
AST: 16 U/L (ref 11–38)
Albumin: 3.7 g/dL (ref 3.3–5.5)
Alkaline Phosphatase: 50 U/L (ref 26–84)
BUN: 9 mg/dL (ref 7–22)
CO2: 28 mEq/L (ref 18–33)
Calcium: 9 mg/dL (ref 8.0–10.3)
Chloride: 106 mEq/L (ref 98–108)
Creat: 0.9 mg/dl (ref 0.6–1.2)
Glucose, Bld: 73 mg/dL (ref 73–118)
Potassium: 3.5 mEq/L (ref 3.3–4.7)
Sodium: 142 mEq/L (ref 128–145)
TOTAL PROTEIN: 7.4 g/dL (ref 6.4–8.1)
Total Bilirubin: 0.6 mg/dl (ref 0.20–1.60)

## 2017-03-31 LAB — RETICULOCYTES: Reticulocyte Count: 1.2 % (ref 0.6–2.6)

## 2017-03-31 LAB — FERRITIN: Ferritin: 17 ng/ml (ref 9–269)

## 2017-03-31 LAB — CHCC SATELLITE - SMEAR

## 2017-03-31 MED ORDER — FOLIC ACID 1 MG PO TABS: 1.0000 mg | ORAL_TABLET | Freq: Every day | ORAL | 11 refills | Status: DC

## 2017-03-31 MED FILL — FOLIC ACID 1 MG TABLET: 1 | 29 days supply | Qty: 29 | Fill #0

## 2017-03-31 NOTE — Progress Notes (Signed)
Hematology/Oncology Consultation   Name: Carrie Maynard      MRN: [id number]    Location: Room/bed info not found  Date: 03/31/2017 Time:8:55 AM   REFERRING PHYSICIAN: Maurice Small, MD  REASON FOR CONSULT: Iron deficiency anemia    DIAGNOSIS:  Iron deficiency anemia  HISTORY OF PRESENT ILLNESS: Carrie Maynard is a very pleasant 36 yo African American female with iron deficiency anemia and sickle cell trait.  She is on an oral iron supplement and thinks she may get folic acid with her multivitamin but is not sure.  She is symptomatic with fatigue, chills, palpitations and numbness and tingling in her feet that comes and goes.  She states that her mother, brother and son also have the sickle cell trait and iron deficiency anemia. Her maternal aunt has sickle cell disease. Her mother is originally from [place].  She was also found to be vitamin D deficient and is now on 2,500 units daily.  Her cycle has been heavy in the past. She did not respond to oral contraception or the mirena. She is now on a homeopathic regimen with apple cider vinegar and ibuprofen. This has helped her cycle go to 4-5 days with 3 heavy days.  She has had no other bleeding, bruising or petechiae. Bo lymphadenopathy found on exam.  She hash ad multiple surgeries in the past including: 2 C-sections, 2 hernia repairs and a tummy tuck with no complications or bleeding.  She has never received IV iron or blood.  No fever, n/v, cough, rash, dizziness, SOB, chest pain, abdominal pain or changes in bowel or bladder habits.  No swelling or tenderness in her extremities. No c/o pain at this time.  No falls or syncope.  She has maintained a good appetite and is staying well hydrated. Her weight is stable.  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   ROS: All other 10 point review of systems is negative.   PAST MEDICAL HISTORY:   No past medical history on file.  ALLERGIES: Allergies  Allergen Reactions  . Latex Rash     MEDICATIONS:  Current Outpatient Medications on File Prior to Visit  Medication Sig Dispense Refill  . APPLE CIDER VINEGAR PO Take 2 tablets by mouth daily.    Marland Kitchen CRANBERRY PO Take 2 tablets by mouth 2 (two) times daily.    . ferrous gluconate (FERGON) 324 MG tablet Take 324 mg by mouth daily with breakfast.     No current facility-administered medications on file prior to visit.      PAST SURGICAL HISTORY Past Surgical History:  Procedure Laterality Date  . CESAREAN SECTION    . hernia  04/2010  . HERNIA REPAIR    . tummy tuck  04/2010    FAMILY HISTORY: No family history on file.  SOCIAL HISTORY:  reports that  has never smoked. She does not have any smokeless tobacco history on file. She reports that she drinks alcohol. She reports that she does not use drugs.  PERFORMANCE STATUS: The patient's performance status is 1 - Symptomatic but completely ambulatory  PHYSICAL EXAM: Most Recent Vital Signs: There were no vitals taken for this visit. BP (!) 144/85 (BP Location: Left Arm, Patient Position: Sitting)   Pulse 86   Temp 97.9 F (36.6 C) (Oral)   Resp 16   Wt 167 lb (75.8 kg)   SpO2 100%   BMI 26.95 kg/m   General Appearance:    Alert, cooperative, no distress, appears  stated age  Head:    Normocephalic, without obvious abnormality, atraumatic  Eyes:    PERRL, conjunctiva/corneas clear, EOM's intact, fundi    benign, both eyes        Throat:   Lips, mucosa, and tongue normal; teeth and gums normal  Neck:   Supple, symmetrical, trachea midline, no adenopathy;    thyroid:  no enlargement/tenderness/nodules; no carotid   bruit or JVD  Back:     Symmetric, no curvature, ROM normal, no CVA tenderness  Lungs:     Clear to auscultation bilaterally, respirations unlabored  Chest Wall:    No tenderness or deformity   Heart:    Regular rate and rhythm, S1 and S2 normal, no murmur, rub   or gallop     Abdomen:     Soft, non-tender, bowel sounds active all four  quadrants,    no masses, no organomegaly        Extremities:   Extremities normal, atraumatic, no cyanosis or edema  Pulses:   2+ and symmetric all extremities  Skin:   Skin color, texture, turgor normal, no rashes or lesions  Lymph nodes:   Cervical, supraclavicular, and axillary nodes normal  Neurologic:   CNII-XII intact, normal strength, sensation and reflexes    throughout    LABORATORY DATA:  Results for orders placed or performed in visit on 03/31/17 (from the past 48 hour(s))  CBC w/Diff     Status: Abnormal   Collection Time: 03/31/17  8:34 AM  Result Value Ref Range   WBC 4.6 3.9 - 10.0 10e3/uL   RBC 4.57 3.70 - 5.32 10e6/uL   HGB 12.5 11.6 - 15.9 g/dL   HCT 36.4 34.8 - 46.6 %   MCV 80 (L) 81 - 101 fL   MCH 27.4 26.0 - 34.0 pg   MCHC 34.3 32.0 - 36.0 g/dL   RDW 14.4 11.1 - 15.7 %   Platelets 245 145 - 400 10e3/uL   NEUT# 2.0 1.5 - 6.5 10e3/uL   LYMPH# 1.9 0.9 - 3.3 10e3/uL   MONO# 0.4 0.1 - 0.9 10e3/uL   Eosinophils Absolute 0.1 0.0 - 0.5 10e3/uL   BASO# 0.0 0.0 - 0.2 10e3/uL   NEUT% 44.5 39.6 - 80.0 %   LYMPH% 42.2 14.0 - 48.0 %   MONO% 9.7 0.0 - 13.0 %   EOS% 2.9 0.0 - 7.0 %   BASO% 0.7 0.0 - 2.0 %      RADIOGRAPHY: No results found.     PATHOLOGY: None   ASSESSMENT/PLAN: Carrie Maynard is a very pleasant 36 yo African American female with iron deficiency anemia secondary to chronic blood loss with heavy cycles and sickle cell trait. She is taking her oral iron supplement daily. She is symptomatic at this time as mentioned above.  We will see what her iron studies show and bring her back in next week for an infusion if needed. B 12 level is pending.  We will have her start taking 1 mg folic acid daily. prescription sent to her pharmacy.   We will also get an US of the gallbladder to evaluate for stones.  We will go ahead and plan to see her back again in another 4 months for follow-up.   All questions were answered and she is in agreement with the plan. She  will contact our office with any questions or concerns. We can certainly see her much sooner if necessary.  She was discussed with and also seen by Dr. Marin Olp and  he is in agreement with the aforementioned.   [facility]    Addendum: I saw and examined Ms. Vandervoort with Judson Roch.  I agree with the above assessment.  I am sure that she probably has an element of iron malabsorption.  I will get her blood under the microscope.  She has some anisocytosis and poikilocytosis.  She has some microcytic red blood cells.  There were no nucleated red blood cells.  Her iron studies showed a ferritin of only 17 with an iron saturation of 10%.  I am sure that she has an element of iron loss from her monthly cycles.  Her erythropoietin level is only 11.  As such, this might be an issue for [place] if she cannot improve her anemia with iron replacement therapy.  We spent about 40 minutes with her.  I answered all of her questions.  We will reassure her that everything should be improved after iron supplementation.  We will plan to see her back in another 4-6 weeks.  Lattie Haw, MD

## 2017-04-01 LAB — ERYTHROPOIETIN: Erythropoietin: 11.1 m[IU]/mL (ref 2.6–18.5)

## 2017-04-01 LAB — VITAMIN B12: VITAMIN B 12: 610 pg/mL (ref 232–1245)

## 2017-04-02 ENCOUNTER — Other Ambulatory Visit: Payer: Self-pay | Admitting: Family

## 2017-04-02 DIAGNOSIS — D509 Iron deficiency anemia, unspecified: Secondary | ICD-10-CM | POA: Insufficient documentation

## 2017-04-02 DIAGNOSIS — D500 Iron deficiency anemia secondary to blood loss (chronic): Secondary | ICD-10-CM

## 2017-04-05 ENCOUNTER — Ambulatory Visit (HOSPITAL_BASED_OUTPATIENT_CLINIC_OR_DEPARTMENT_OTHER)
Admission: RE | Admit: 2017-04-05 | Discharge: 2017-04-05 | Disposition: A | Discharge status: Home / Self Care | Payer: 59 | Source: Ambulatory Visit | Attending: Family | Admitting: Family

## 2017-04-05 DIAGNOSIS — R109 Unspecified abdominal pain: Secondary | ICD-10-CM | POA: Diagnosis not present

## 2017-04-05 DIAGNOSIS — D573 Sickle-cell trait: Secondary | ICD-10-CM | POA: Insufficient documentation

## 2017-04-06 ENCOUNTER — Encounter: Payer: Self-pay | Admitting: Family

## 2017-04-30 MED FILL — FOLIC ACID 1 MG TABLET: 1 | 30 days supply | Qty: 30 | Fill #1

## 2017-04-30 MED FILL — FERROUS GLUCONATE 324 MG TA: 324 (38 FE) | 30 days supply | Qty: 30 | Fill #1

## 2017-06-04 ENCOUNTER — Ambulatory Visit: Payer: 59 | Admitting: Family

## 2017-06-04 ENCOUNTER — Other Ambulatory Visit: Payer: 59

## 2017-06-28 ENCOUNTER — Inpatient Hospital Stay: Payer: 59

## 2017-06-28 ENCOUNTER — Other Ambulatory Visit: Payer: Self-pay

## 2017-06-28 ENCOUNTER — Inpatient Hospital Stay: Payer: 59 | Attending: Family | Admitting: Family

## 2017-06-28 ENCOUNTER — Encounter: Payer: Self-pay | Admitting: Family

## 2017-06-28 VITALS — BP 138/82 | HR 83 | Temp 98.3°F | Resp 20 | Wt 170.0 lb

## 2017-06-28 DIAGNOSIS — D500 Iron deficiency anemia secondary to blood loss (chronic): Secondary | ICD-10-CM

## 2017-06-28 DIAGNOSIS — D573 Sickle-cell trait: Secondary | ICD-10-CM | POA: Diagnosis not present

## 2017-06-28 DIAGNOSIS — D509 Iron deficiency anemia, unspecified: Secondary | ICD-10-CM | POA: Insufficient documentation

## 2017-06-28 DIAGNOSIS — R7989 Other specified abnormal findings of blood chemistry: Secondary | ICD-10-CM

## 2017-06-28 LAB — CBC WITH DIFFERENTIAL (CANCER CENTER ONLY)
Basophils Absolute: 0 10*3/uL (ref 0.0–0.1)
Basophils Relative: 1 %
Eosinophils Absolute: 0.1 10*3/uL (ref 0.0–0.5)
Eosinophils Relative: 2 %
HCT: 36.1 % (ref 34.8–46.6)
HEMOGLOBIN: 12.5 g/dL (ref 11.6–15.9)
LYMPHS PCT: 41 %
Lymphs Abs: 1.9 10*3/uL (ref 0.9–3.3)
MCH: 28.3 pg (ref 26.0–34.0)
MCHC: 34.6 g/dL (ref 32.0–36.0)
MCV: 81.7 fL (ref 81.0–101.0)
MONO ABS: 0.5 10*3/uL (ref 0.1–0.9)
MONOS PCT: 11 %
NEUTROS PCT: 45 %
Neutro Abs: 2.1 10*3/uL (ref 1.5–6.5)
Platelet Count: 250 10*3/uL (ref 145–400)
RBC: 4.42 MIL/uL (ref 3.70–5.32)
RDW: 13.3 % (ref 11.1–15.7)
WBC Count: 4.6 10*3/uL (ref 3.9–10.0)

## 2017-06-28 LAB — CMP (CANCER CENTER ONLY)
ALT: 14 U/L (ref 0–55)
AST: 15 U/L (ref 5–34)
Albumin: 3.6 g/dL (ref 3.5–5.0)
Alkaline Phosphatase: 44 U/L (ref 40–150)
Anion gap: 7 (ref 3–11)
BILIRUBIN TOTAL: 0.4 mg/dL (ref 0.2–1.2)
BUN: 12 mg/dL (ref 7–26)
CHLORIDE: 107 mmol/L (ref 98–109)
CO2: 25 mmol/L (ref 22–29)
Calcium: 9.5 mg/dL (ref 8.4–10.4)
Creatinine: 0.94 mg/dL (ref 0.60–1.10)
GLUCOSE: 83 mg/dL (ref 70–140)
POTASSIUM: 3.9 mmol/L (ref 3.5–5.1)
Sodium: 139 mmol/L (ref 136–145)
Total Protein: 7.3 g/dL (ref 6.4–8.3)

## 2017-06-28 LAB — IRON AND TIBC
IRON: 121 ug/dL (ref 41–142)
Saturation Ratios: 38 % (ref 21–57)
TIBC: 319 ug/dL (ref 236–444)
UIBC: 198 ug/dL

## 2017-06-28 LAB — FERRITIN: Ferritin: 10 ng/mL (ref 9–269)

## 2017-06-28 NOTE — Progress Notes (Signed)
Hematology and Oncology Follow Up Visit  Carrie Maynard [id number] [birth year] 37 y.o. 06/28/2017   Principle Diagnosis:  Iron deficiency anemia Sickle cell trait  Current Therapy:   Folic acid 1 mg PO daily IV iron as indicated   Interim History:  Carrie Maynard is here today for follow-up. She is doing well and states that most of her symptoms have resolved. She still has occasional palpitations not associated with stress.  Her cycles has become regular and less heavy with taking apple cider vinegar. She has had no other episodes of bleeding.  No fever, chills, n/v, cough, rash, dizziness, SOB, chest pain, abdominal pain or changes in bowel or bladder habits.  No swelling or tenderness in her extremities. She has tingling in her legs due to vascular issues. She wears compression stockings regularly for support.  She maintained a good appetite and is staying well hydrated. Her weight is stable.   ECOG Performance Status: 1 - Symptomatic but completely ambulatory  Medications:  Allergies as of 06/28/2017      Reactions   Latex Rash      Medication List        Accurate as of 06/28/17  9:37 AM. Always use your most recent med list.          APPLE CIDER VINEGAR PO Take 2 tablets by mouth daily.   D3 VITAMIN PO Take 2,500 Units by mouth daily.   ferrous gluconate 324 MG tablet Commonly known as:  FERGON Take 324 mg by mouth daily with breakfast.   folic acid 1 MG tablet Commonly known as:  FOLVITE Take 1 tablet (1 mg total) by mouth daily.   MULTIVITAMIN ADULT PO Take by mouth daily.   vitamin C with rose hips 500 MG tablet Take 500 mg by mouth daily.       Allergies:  Allergies  Allergen Reactions  . Latex Rash    Past Medical History, Surgical history, Social history, and Family History were reviewed and updated.  Review of Systems: All other 10 point review of systems is negative.   Physical Exam:  weight is 170 lb (77.1 kg). Her temperature is 98.3 F  (36.8 C). Her blood pressure is 138/82 and her pulse is 83. Her respiration is 20 and oxygen saturation is 100%.   Wt Readings from Last 3 Encounters:  06/28/17 170 lb (77.1 kg)  03/31/17 167 lb (75.8 kg)  04/03/13 164 lb (74.4 kg)    Ocular: Sclerae unicteric, pupils equal, round and reactive to light Ear-nose-throat: Oropharynx clear, dentition fair Lymphatic: No cervical, supraclavicular or axillary adenopathy Lungs no rales or rhonchi, good excursion bilaterally Heart regular rate and rhythm, no murmur appreciated Abd soft, nontender, positive bowel sounds, no liver or spleen tip palpated on exam, no fluid wave  MSK no focal spinal tenderness, no joint edema Neuro: non-focal, well-oriented, appropriate affect Breasts: Deferred   Lab Results  Component Value Date   WBC 4.6 06/28/2017   HGB 12.5 03/31/2017   HCT 36.1 06/28/2017   MCV 81.7 06/28/2017   PLT 250 06/28/2017   Lab Results  Component Value Date   FERRITIN 17 03/31/2017   IRON 33 (L) 03/31/2017   TIBC 319 03/31/2017   UIBC 286 03/31/2017   IRONPCTSAT 10 (L) 03/31/2017   Lab Results  Component Value Date   RBC 4.42 06/28/2017   No results found for: KPAFRELGTCHN, LAMBDASER, KAPLAMBRATIO No results found for: IGGSERUM, IGA, IGMSERUM No results found for: TOTALPROTELP, ALBUMINELP, A1GS, A2GS, BETS, BETA2SER,  Danise Edge, [place]   Chemistry      Component Value Date/Time   NA 142 03/31/2017 0834   K 3.5 03/31/2017 0834   CL 106 03/31/2017 0834   CO2 28 03/31/2017 0834   BUN 9 03/31/2017 0834   CREATININE 0.9 03/31/2017 0834      Component Value Date/Time   CALCIUM 9.0 03/31/2017 0834   ALKPHOS 50 03/31/2017 0834   AST 16 03/31/2017 0834   ALT 20 03/31/2017 0834   BILITOT 0.60 03/31/2017 0834      Impression and Plan: Carrie Maynard is a very pleasant 37 yo African American female with iron deficiency anemia and the sickle cell trait. She is doing well taking her folic acid and iron supplement daily as  prescribed.  We will see what her iron studies show and bring her back in for infusion if needed.  We will go ahead and plan to see her back in another 3 months for follow-up.  She will contact our office with any questions or concerns. We can certainly see her sooner if need be.   Laverna Peace, NP 2/25/20199:37 AM

## 2017-08-06 MED FILL — FERROUS GLUCONATE 324 MG TA: 324 (38 FE) | 90 days supply | Qty: 90 | Fill #2

## 2017-08-06 MED FILL — FOLIC ACID 1 MG TABLET: 1 | 90 days supply | Qty: 90 | Fill #2

## 2017-08-26 ENCOUNTER — Ambulatory Visit: Payer: Self-pay | Admitting: Nurse Practitioner

## 2017-08-26 VITALS — BP 125/85 | HR 100 | Temp 98.2°F | Resp 16 | Wt 171.6 lb

## 2017-08-26 DIAGNOSIS — L089 Local infection of the skin and subcutaneous tissue, unspecified: Secondary | ICD-10-CM

## 2017-08-26 DIAGNOSIS — B9689 Other specified bacterial agents as the cause of diseases classified elsewhere: Secondary | ICD-10-CM

## 2017-08-26 MED ORDER — FLUCONAZOLE 150 MG PO TABS: 150.0000 mg | ORAL_TABLET | Freq: Once | ORAL | 0 refills | Status: AC

## 2017-08-26 MED ORDER — CEPHALEXIN 500 MG PO CAPS: 500.0000 mg | ORAL_CAPSULE | Freq: Three times a day (TID) | ORAL | 0 refills | Status: AC

## 2017-08-26 MED FILL — FLUCONAZOLE 150 MG TABS: 150 | 9 days supply | Qty: 3 | Fill #0

## 2017-08-26 MED FILL — CEPHALEXIN 500 MG CAPSULE: 500 | 10 days supply | Qty: 30 | Fill #0

## 2017-08-26 NOTE — Progress Notes (Signed)
Subjective:    Carrie Maynard is a 37 y.o. female who presents for evaluation of a possible skin infection located right chest well. Symptoms include moderate pain and erythema located on the right chest wall at the base of the induration. Patient denies chills and fever greater than 100. Precipitating event: none known. Treatment to date has included warm compresses and antibacterial soap with minimal relief.  Patient denies any history of previous infections and does not have a history of DM.  The following portions of the patient's history were reviewed and updated as appropriate: allergies, current medications and past medical history.  Review of Systems Constitutional: negative Ears, nose, mouth, throat, and face: negative Respiratory: negative Cardiovascular: negative Gastrointestinal: negative Integument/breast: positive for skin color change and induration to right chest, negative for pruritus and rash     Objective:    BP 125/85 (BP Location: Right Arm, Patient Position: Sitting, Cuff Size: Normal)   Pulse 100   Temp 98.2 F (36.8 C) (Oral)   Resp 16   Wt 171 lb 9.6 oz (77.8 kg)   SpO2 98%   BMI 27.70 kg/m  General appearance: alert, cooperative, mild distress and due to discomfort Head: Normocephalic, without obvious abnormality, atraumatic Ears: normal TM's and external ear canals both ears Nose: Nares normal. Septum midline. Mucosa normal. No drainage or sinus tenderness. Throat: lips, mucosa, and tongue normal; teeth and gums normal Lungs: clear to auscultation bilaterally Heart: regular rate and rhythm, S1, S2 normal, no murmur, click, rub or gallop Abdomen: soft, non-tender; bowel sounds normal; no masses,  no organomegaly Skin: erythema - inner right chest well, medial to breast and papular - 0.5cm induration, warm to palpation, tender to palpation and firm, small amount of white drainage expectorated Lymph nodes: cervical and submandibular nodes normal      Assessment:   Bacterial Skin Infection of Right Chest Wall   Plan:    Keflex prescribed. Neurosurgeon distributed. Clean affected area with Hibiclens soap twice daily.  Warm compresses 3 to 4 times daily.  Keep area clean and dry.  Go to ER if develop fever, chills, foul smelling drainage or increasing redness or other concerns.  Diflucan prescribed for prophylaxis against yeast infection.  Patient verbalizes understanding and has no questions at time of discharge.   Meds ordered this encounter  Medications  . cephALEXin (KEFLEX) 500 MG capsule    Sig: Take 1 capsule (500 mg total) by mouth 3 (three) times daily for 10 days.    Dispense:  30 capsule    Refill:  0    Order Specific Question:   Supervising Provider    Answer:   Ricard Dillon [[id number]]  . fluconazole (DIFLUCAN) 150 MG tablet    Sig: Take 1 tablet (150 mg total) by mouth once for 1 dose. 1st tablet if symptoms, may repeat in 3 days if needed, repeat in 3 more days if needed.    Dispense:  3 tablet    Refill:  0    Order Specific Question:   Supervising Provider    Answer:   Ricard Dillon [phone]

## 2017-08-26 NOTE — Patient Instructions (Signed)

## 2017-08-31 ENCOUNTER — Telehealth: Payer: Self-pay

## 2017-08-31 NOTE — Telephone Encounter (Signed)
Mailbox of the patient is full and cannot accept any message.

## 2017-09-20 ENCOUNTER — Inpatient Hospital Stay: Payer: 59 | Attending: Family

## 2017-09-20 ENCOUNTER — Inpatient Hospital Stay: Payer: 59 | Admitting: Family

## 2017-09-20 DIAGNOSIS — Z01419 Encounter for gynecological examination (general) (routine) without abnormal findings: Secondary | ICD-10-CM | POA: Diagnosis not present

## 2017-09-20 DIAGNOSIS — R102 Pelvic and perineal pain: Secondary | ICD-10-CM | POA: Diagnosis not present

## 2017-11-19 DIAGNOSIS — M238X2 Other internal derangements of left knee: Secondary | ICD-10-CM | POA: Diagnosis not present

## 2017-11-19 DIAGNOSIS — M25561 Pain in right knee: Secondary | ICD-10-CM | POA: Diagnosis not present

## 2017-11-19 DIAGNOSIS — M25562 Pain in left knee: Secondary | ICD-10-CM | POA: Diagnosis not present

## 2017-11-19 DIAGNOSIS — M238X1 Other internal derangements of right knee: Secondary | ICD-10-CM | POA: Diagnosis not present

## 2017-12-27 ENCOUNTER — Other Ambulatory Visit: Payer: Self-pay | Admitting: Family Medicine

## 2017-12-27 DIAGNOSIS — N644 Mastodynia: Secondary | ICD-10-CM

## 2017-12-27 DIAGNOSIS — R7309 Other abnormal glucose: Secondary | ICD-10-CM | POA: Diagnosis not present

## 2017-12-27 DIAGNOSIS — N920 Excessive and frequent menstruation with regular cycle: Secondary | ICD-10-CM | POA: Diagnosis not present

## 2017-12-27 DIAGNOSIS — D509 Iron deficiency anemia, unspecified: Secondary | ICD-10-CM | POA: Diagnosis not present

## 2017-12-27 DIAGNOSIS — Z5181 Encounter for therapeutic drug level monitoring: Secondary | ICD-10-CM | POA: Diagnosis not present

## 2017-12-27 DIAGNOSIS — Z13220 Encounter for screening for lipoid disorders: Secondary | ICD-10-CM | POA: Diagnosis not present

## 2017-12-27 DIAGNOSIS — Z0000 Encounter for general adult medical examination without abnormal findings: Secondary | ICD-10-CM | POA: Diagnosis not present

## 2017-12-27 DIAGNOSIS — D573 Sickle-cell trait: Secondary | ICD-10-CM | POA: Diagnosis not present

## 2017-12-27 DIAGNOSIS — D500 Iron deficiency anemia secondary to blood loss (chronic): Secondary | ICD-10-CM | POA: Diagnosis not present

## 2017-12-30 ENCOUNTER — Ambulatory Visit
Admission: RE | Admit: 2017-12-30 | Discharge: 2017-12-30 | Disposition: A | Discharge status: Home / Self Care | Payer: 59 | Source: Ambulatory Visit | Attending: Family Medicine | Admitting: Family Medicine

## 2017-12-30 DIAGNOSIS — N644 Mastodynia: Secondary | ICD-10-CM

## 2017-12-30 DIAGNOSIS — R922 Inconclusive mammogram: Secondary | ICD-10-CM | POA: Diagnosis not present

## 2018-01-12 ENCOUNTER — Inpatient Hospital Stay: Payer: 59

## 2018-01-12 ENCOUNTER — Encounter: Payer: Self-pay | Admitting: Family

## 2018-01-12 ENCOUNTER — Other Ambulatory Visit: Payer: Self-pay

## 2018-01-12 ENCOUNTER — Inpatient Hospital Stay: Payer: 59 | Attending: Family | Admitting: Family

## 2018-01-12 VITALS — BP 132/78 | HR 84

## 2018-01-12 VITALS — BP 148/85 | HR 90 | Temp 98.1°F | Resp 17 | Wt 170.0 lb

## 2018-01-12 DIAGNOSIS — R200 Anesthesia of skin: Secondary | ICD-10-CM | POA: Diagnosis not present

## 2018-01-12 DIAGNOSIS — D573 Sickle-cell trait: Secondary | ICD-10-CM | POA: Diagnosis not present

## 2018-01-12 DIAGNOSIS — K5900 Constipation, unspecified: Secondary | ICD-10-CM | POA: Insufficient documentation

## 2018-01-12 DIAGNOSIS — R51 Headache: Secondary | ICD-10-CM | POA: Insufficient documentation

## 2018-01-12 DIAGNOSIS — D500 Iron deficiency anemia secondary to blood loss (chronic): Secondary | ICD-10-CM

## 2018-01-12 DIAGNOSIS — D509 Iron deficiency anemia, unspecified: Secondary | ICD-10-CM | POA: Diagnosis not present

## 2018-01-12 MED ORDER — METHYLPREDNISOLONE SODIUM SUCC 125 MG IJ SOLR
80.0000 mg | Freq: Once | INTRAMUSCULAR | Status: AC
  Administered 2018-01-12: 80 mg via INTRAVENOUS

## 2018-01-12 MED ORDER — FERUMOXYTOL INJECTION 510 MG/17 ML
510.0000 mg | Freq: Once | INTRAVENOUS | Status: AC
  Administered 2018-01-12: 510 mg via INTRAVENOUS
  Filled 2018-01-12: qty 17

## 2018-01-12 MED ORDER — SODIUM CHLORIDE 0.9 % IV SOLN
Freq: Once | INTRAVENOUS | Status: AC
  Administered 2018-01-12: 12:00:00 via INTRAVENOUS
  Filled 2018-01-12: qty 250

## 2018-01-12 MED ORDER — DIPHENHYDRAMINE HCL 50 MG/ML IJ SOLN
25.0000 mg | Freq: Once | INTRAMUSCULAR | Status: AC
  Administered 2018-01-12: 25 mg via INTRAVENOUS

## 2018-01-12 NOTE — Progress Notes (Signed)
Hematology and Oncology Follow Up Visit  ICY FUHRMANN [id number] May 05, 1980 37 y.o. 01/12/2018   Principle Diagnosis:  Iron deficiency anemia Sickle cell trait  Current Therapy:   Folic acid 1 mg PO daily PO iron supplement daily   Interim History: Ms. Chakraborty is here today for follow-up. She is symptomatic with headaches, fatigue, palpitations, chills, lightheadedness, occasional numbness and tingling in her hands and feet.  Oral iron has caused her GI upset and constipation. Her counts have not improved with this. Her ferritin last week with her PCP was 5 and Hgb 11.8.  She is still taking apple cider vinegar which has helped her cycle be regular with normal flow. This is now only lasting 5-7 days instead of 14.  No fever, chills, n/v, cough, rash, dizziness, SOB, abdominal pain or changes in bowel or bladder habits.  No other bleeding, no bruising or petechiae.  No swelling or tenderness in her extremities. No lymphadenopathy noted on exam.  She has maintained a good appetite and is staying well hydrated. Her weight is stable.   ECOG Performance Status: 1 - Symptomatic but completely ambulatory  Medications:  Allergies as of 01/12/2018      Reactions   Latex Rash      Medication List        Accurate as of 01/12/18 11:00 AM. Always use your most recent med list.          APPLE CIDER VINEGAR PO Take 2 tablets by mouth daily.   D3 VITAMIN PO Take 2,500 Units by mouth daily.   ferrous gluconate 324 MG tablet Commonly known as:  FERGON Take 324 mg by mouth daily with breakfast.   folic acid 1 MG tablet Commonly known as:  FOLVITE Take 1 tablet (1 mg total) by mouth daily.   MULTIVITAMIN ADULT PO Take by mouth daily.   vitamin C with rose hips 500 MG tablet Take 500 mg by mouth daily.       Allergies:  Allergies  Allergen Reactions  . Latex Rash    Past Medical History, Surgical history, Social history, and Family History were reviewed and  updated.  Review of Systems: All other 10 point review of systems is negative.   Physical Exam:  weight is 170 lb (77.1 kg). Her oral temperature is 98.1 F (36.7 C). Her blood pressure is 148/85 (abnormal) and her pulse is 90. Her respiration is 17 and oxygen saturation is 100%.   Wt Readings from Last 3 Encounters:  01/12/18 170 lb (77.1 kg)  08/26/17 171 lb 9.6 oz (77.8 kg)  06/28/17 170 lb (77.1 kg)    Ocular: Sclerae unicteric, pupils equal, round and reactive to light Ear-nose-throat: Oropharynx clear, dentition fair Lymphatic: No cervical, supraclavicular or axillary adenopathy Lungs no rales or rhonchi, good excursion bilaterally Heart regular rate and rhythm, no murmur appreciated Abd soft, nontender, positive bowel sounds, no liver or spleen tip palpated on exam, no fluid wave  MSK no focal spinal tenderness, no joint edema Neuro: non-focal, well-oriented, appropriate affect Breasts: Deferred   Lab Results  Component Value Date   WBC 4.6 06/28/2017   HGB 12.5 06/28/2017   HCT 36.1 06/28/2017   MCV 81.7 06/28/2017   PLT 250 06/28/2017   Lab Results  Component Value Date   FERRITIN 10 06/28/2017   IRON 121 06/28/2017   TIBC 319 06/28/2017   UIBC 198 06/28/2017   IRONPCTSAT 38 06/28/2017   Lab Results  Component Value Date   RBC 4.42 06/28/2017  No results found for: KPAFRELGTCHN, LAMBDASER, KAPLAMBRATIO No results found for: IGGSERUM, IGA, IGMSERUM No results found for: Odetta Pink, SPEI   Chemistry      Component Value Date/Time   NA 139 06/28/2017 0850   NA 142 03/31/2017 0834   K 3.9 06/28/2017 0850   K 3.5 03/31/2017 0834   CL 107 06/28/2017 0850   CL 106 03/31/2017 0834   CO2 25 06/28/2017 0850   CO2 28 03/31/2017 0834   BUN 12 06/28/2017 0850   BUN 9 03/31/2017 0834   CREATININE 0.94 06/28/2017 0850   CREATININE 0.9 03/31/2017 0834      Component Value Date/Time   CALCIUM 9.5  06/28/2017 0850   CALCIUM 9.0 03/31/2017 0834   ALKPHOS 44 06/28/2017 0850   ALKPHOS 50 03/31/2017 0834   AST 15 06/28/2017 0850   ALT 14 06/28/2017 0850   ALT 20 03/31/2017 0834   BILITOT 0.4 06/28/2017 0850      Impression and Plan: Ms. Seldon is a very pleasant 37 yo African American female with iron deficiency anemia and the sickle cell trait.  She is taking her folic acid daily as prescribed.  Despit taking her oral iron supplement as directed her ferritin is still only 5. She is symptomatic as mentioned above and would like to try IV iron now. We discussed the potential cost with and without insurance. She still wants to move forward with infusion.  We will plan to see her back in another 6 weeks for follow-up and repeat lab work.  She will contact our office with any questions or concerns. We can certainly see her sooner if need be.   Laverna Peace, NP 9/11/201911:00 AM

## 2018-01-12 NOTE — Patient Instructions (Signed)

## 2018-01-12 NOTE — Progress Notes (Signed)
Feraheme started at 1157, at 1207 pt began complaining of watery eyes/nose and itchy throat.  Feraheme stopped.  Dr. Marin Olp notified.  VS stable.  Benadryl 25 mg IV and Solumedrol 80 mg IV given per MD order.  At 1255, pt without complaints and Feraheme restarted.  Pt finished Feraheme without difficulty.

## 2018-02-23 ENCOUNTER — Encounter: Payer: Self-pay | Admitting: Family

## 2018-02-23 ENCOUNTER — Inpatient Hospital Stay: Payer: 59

## 2018-02-23 ENCOUNTER — Inpatient Hospital Stay: Payer: 59 | Attending: Family | Admitting: Family

## 2018-02-23 ENCOUNTER — Other Ambulatory Visit: Payer: Self-pay

## 2018-02-23 VITALS — BP 133/77 | HR 69 | Temp 98.6°F | Resp 18 | Wt 170.4 lb

## 2018-02-23 DIAGNOSIS — D500 Iron deficiency anemia secondary to blood loss (chronic): Secondary | ICD-10-CM | POA: Diagnosis not present

## 2018-02-23 DIAGNOSIS — D573 Sickle-cell trait: Secondary | ICD-10-CM | POA: Diagnosis not present

## 2018-02-23 DIAGNOSIS — N920 Excessive and frequent menstruation with regular cycle: Secondary | ICD-10-CM | POA: Diagnosis not present

## 2018-02-23 DIAGNOSIS — R7989 Other specified abnormal findings of blood chemistry: Secondary | ICD-10-CM

## 2018-02-23 LAB — CBC WITH DIFFERENTIAL (CANCER CENTER ONLY)
Abs Immature Granulocytes: 0.01 10*3/uL (ref 0.00–0.07)
BASOS ABS: 0 10*3/uL (ref 0.0–0.1)
Basophils Relative: 1 %
EOS ABS: 0.1 10*3/uL (ref 0.0–0.5)
Eosinophils Relative: 2 %
HEMATOCRIT: 38.5 % (ref 36.0–46.0)
Hemoglobin: 12.5 g/dL (ref 12.0–15.0)
IMMATURE GRANULOCYTES: 0 %
LYMPHS ABS: 1.6 10*3/uL (ref 0.7–4.0)
Lymphocytes Relative: 41 %
MCH: 26.9 pg (ref 26.0–34.0)
MCHC: 32.5 g/dL (ref 30.0–36.0)
MCV: 83 fL (ref 80.0–100.0)
Monocytes Absolute: 0.4 10*3/uL (ref 0.1–1.0)
Monocytes Relative: 10 %
NEUTROS PCT: 46 %
NRBC: 0 % (ref 0.0–0.2)
Neutro Abs: 1.7 10*3/uL (ref 1.7–7.7)
Platelet Count: 266 10*3/uL (ref 150–400)
RBC: 4.64 MIL/uL (ref 3.87–5.11)
RDW: 14.6 % (ref 11.5–15.5)
WBC: 3.8 10*3/uL — AB (ref 4.0–10.5)

## 2018-02-23 NOTE — Progress Notes (Signed)
Hematology and Oncology Follow Up Visit  MALAISHA Maynard [id number] 1980/11/11 37 y.o. 02/23/2018   Principle Diagnosis:  Iron deficiency anemia Sickle cell trait  Current Therapy:   Folic acid 1 mg PO daily PO iron supplement daily   Interim History:  Carrie Maynard is here today for follow-up. She is feeling much better since her infusion last month. She had a reaction to the feraheme with itchy watery eyes and throat, runny nose as well as body aches. We will change her to [facility] for all future infusions.  Her cycle continues to be heavy but tolerable and only lasting 5-7 days.  She has had no other episodes of bleeding, no bruising or petechiae.  She states that she is taking her folic acid daily as prescribed.  No fever, chills, n/v, cough, rash, dizziness, SOB, chest pain, palpitations, abdominal pain or changes in bowel or bladder habits.  No swelling, tenderness, numbness or tingling in her extremities. No c/o pain.  No lymphadenopathy noted one exam.  She has a good appetite and is staying well hydrated. Her weight is stable.   ECOG Performance Status: 1 - Symptomatic but completely ambulatory  Medications:  Allergies as of 02/23/2018      Reactions   Latex Rash      Medication List        Accurate as of 02/23/18 10:49 AM. Always use your most recent med list.          APPLE CIDER VINEGAR PO Take 2 tablets by mouth daily.   D3 VITAMIN PO Take 2,500 Units by mouth daily.   ferrous gluconate 324 MG tablet Commonly known as:  FERGON Take 324 mg by mouth daily with breakfast.   folic acid 1 MG tablet Commonly known as:  FOLVITE Take 1 tablet (1 mg total) by mouth daily.   MULTIVITAMIN ADULT PO Take by mouth daily.   vitamin C with rose hips 500 MG tablet Take 500 mg by mouth daily.       Allergies:  Allergies  Allergen Reactions  . Latex Rash    Past Medical History, Surgical history, Social history, and Family History were reviewed and  updated.  Review of Systems: All other 10 point review of systems is negative.   Physical Exam:  vitals were not taken for this visit.   Wt Readings from Last 3 Encounters:  01/12/18 170 lb (77.1 kg)  08/26/17 171 lb 9.6 oz (77.8 kg)  06/28/17 170 lb (77.1 kg)    Ocular: Sclerae unicteric, pupils equal, round and reactive to light Ear-nose-throat: Oropharynx clear, dentition fair Lymphatic: No cervical, supraclavicular or axillary adenopathy Lungs no rales or rhonchi, good excursion bilaterally Heart regular rate and rhythm, no murmur appreciated Abd soft, nontender, positive bowel sounds, no liver or spleen tip palpated on exam, no fluid wave  MSK no focal spinal tenderness, no joint edema Neuro: non-focal, well-oriented, appropriate affect Breasts: Deferred   Lab Results  Component Value Date   WBC 4.6 06/28/2017   HGB 12.5 06/28/2017   HCT 36.1 06/28/2017   MCV 81.7 06/28/2017   PLT 250 06/28/2017   Lab Results  Component Value Date   FERRITIN 10 06/28/2017   IRON 121 06/28/2017   TIBC 319 06/28/2017   UIBC 198 06/28/2017   IRONPCTSAT 38 06/28/2017   Lab Results  Component Value Date   RBC 4.42 06/28/2017   No results found for: KPAFRELGTCHN, LAMBDASER, KAPLAMBRATIO No results found for: IGGSERUM, IGA, IGMSERUM No results found for: TOTALPROTELP,  Ronney Lion, SPEI   Chemistry      Component Value Date/Time   NA 139 06/28/2017 0850   NA 142 03/31/2017 0834   K 3.9 06/28/2017 0850   K 3.5 03/31/2017 0834   CL 107 06/28/2017 0850   CL 106 03/31/2017 0834   CO2 25 06/28/2017 0850   CO2 28 03/31/2017 0834   BUN 12 06/28/2017 0850   BUN 9 03/31/2017 0834   CREATININE 0.94 06/28/2017 0850   CREATININE 0.9 03/31/2017 0834      Component Value Date/Time   CALCIUM 9.5 06/28/2017 0850   CALCIUM 9.0 03/31/2017 0834   ALKPHOS 44 06/28/2017 0850   ALKPHOS 50 03/31/2017 0834   AST 15 06/28/2017 0850   ALT 14 06/28/2017  0850   ALT 20 03/31/2017 0834   BILITOT 0.4 06/28/2017 0850      Impression and Plan: Carrie Maynard is a very pleasant 37 yo African American female with both iron deficiency anemia secondary to heavy cycles and the sickle cell trait.  She is doing well and has no complaints at this time.  We will see what her iron studies show and bring her back in for infusion if needed.  She will contact our office with any questions or concerns. We can certainly see her sooner if need be.   Laverna Peace, NP 10/23/201910:49 AM

## 2018-02-24 LAB — FERRITIN: Ferritin: 34 ng/mL (ref 11–307)

## 2018-02-24 LAB — IRON AND TIBC
Iron: 86 ug/dL (ref 41–142)
SATURATION RATIOS: 32 % (ref 21–57)
TIBC: 273 ug/dL (ref 236–444)
UIBC: 186 ug/dL

## 2018-02-24 LAB — ERYTHROPOIETIN: Erythropoietin: 10.4 m[IU]/mL (ref 2.6–18.5)

## 2018-03-01 ENCOUNTER — Encounter: Payer: Self-pay | Admitting: Family

## 2018-04-25 ENCOUNTER — Inpatient Hospital Stay (HOSPITAL_BASED_OUTPATIENT_CLINIC_OR_DEPARTMENT_OTHER): Payer: 59 | Admitting: Family

## 2018-04-25 ENCOUNTER — Encounter: Payer: Self-pay | Admitting: Family

## 2018-04-25 ENCOUNTER — Other Ambulatory Visit: Payer: Self-pay

## 2018-04-25 ENCOUNTER — Inpatient Hospital Stay: Payer: 59

## 2018-04-25 ENCOUNTER — Inpatient Hospital Stay: Payer: 59 | Attending: Family

## 2018-04-25 VITALS — BP 135/80 | HR 96 | Temp 97.1°F | Resp 18 | Wt 170.0 lb

## 2018-04-25 DIAGNOSIS — D573 Sickle-cell trait: Secondary | ICD-10-CM | POA: Diagnosis not present

## 2018-04-25 DIAGNOSIS — D509 Iron deficiency anemia, unspecified: Secondary | ICD-10-CM

## 2018-04-25 DIAGNOSIS — Z79899 Other long term (current) drug therapy: Secondary | ICD-10-CM

## 2018-04-25 DIAGNOSIS — D500 Iron deficiency anemia secondary to blood loss (chronic): Secondary | ICD-10-CM

## 2018-04-25 LAB — CBC WITH DIFFERENTIAL (CANCER CENTER ONLY)
Abs Immature Granulocytes: 0.01 10*3/uL (ref 0.00–0.07)
Basophils Absolute: 0 10*3/uL (ref 0.0–0.1)
Basophils Relative: 1 %
EOS ABS: 0 10*3/uL (ref 0.0–0.5)
EOS PCT: 1 %
HEMATOCRIT: 40.6 % (ref 36.0–46.0)
Hemoglobin: 13.2 g/dL (ref 12.0–15.0)
IMMATURE GRANULOCYTES: 0 %
LYMPHS ABS: 1.8 10*3/uL (ref 0.7–4.0)
Lymphocytes Relative: 29 %
MCH: 27.7 pg (ref 26.0–34.0)
MCHC: 32.5 g/dL (ref 30.0–36.0)
MCV: 85.3 fL (ref 80.0–100.0)
MONOS PCT: 8 %
Monocytes Absolute: 0.5 10*3/uL (ref 0.1–1.0)
NEUTROS PCT: 61 %
NRBC: 0 % (ref 0.0–0.2)
Neutro Abs: 3.8 10*3/uL (ref 1.7–7.7)
PLATELETS: 305 10*3/uL (ref 150–400)
RBC: 4.76 MIL/uL (ref 3.87–5.11)
RDW: 12.6 % (ref 11.5–15.5)
WBC Count: 6.2 10*3/uL (ref 4.0–10.5)

## 2018-04-25 MED ORDER — FERROUS GLUCONATE 324 (38 FE) MG PO TABS: 324.0000 mg | ORAL_TABLET | Freq: Every day | ORAL | 11 refills | Status: AC

## 2018-04-25 MED ORDER — FOLIC ACID 1 MG PO TABS: 1.0000 mg | ORAL_TABLET | Freq: Every day | ORAL | 11 refills | Status: DC

## 2018-04-25 MED FILL — FOLIC ACID 1 MG TABS: 1 | 30 days supply | Qty: 30 | Fill #0

## 2018-04-25 NOTE — Progress Notes (Signed)
Hematology and Oncology Follow Up Visit  Carrie Maynard [id number] 1981/02/15 37 y.o. 04/25/2018   Principle Diagnosis:  Iron deficiency anemia Sickle cell trait  Current Therapy:   Folic acid 1 mg PO daily PO iron supplement daily   Interim History:  Carrie Maynard is here today for follow-up. She is doing well and has no complaints at this time.  She is taking her iron supplement and folic acid daily as prescribed.  Her cycles is regular and heavy but since starting the apple cider vinegar it seems to be less.  She has had no other bleeding, no bruising or petechiae.  No fever, chills, n/v, cough, rash, dizziness, SOB, chest pain, palpitations, abdominal pain or changes in bowel or bladder habits.  No swelling, tenderness, numbness or tingling in her extremities.  No lymphadenopathy noted on exam.  She has maintained a good appetite and is staying well hydrated. Her weight is stable.   ECOG Performance Status: 0 - Asymptomatic  Medications:  Allergies as of 04/25/2018      Reactions   Latex Rash      Medication List       Accurate as of April 25, 2018 11:59 AM. Always use your most recent med list.        APPLE CIDER VINEGAR PO Take 2 tablets by mouth daily.   D3 VITAMIN PO Take 2,500 Units by mouth daily.   ferrous gluconate 324 MG tablet Commonly known as:  FERGON Take 324 mg by mouth daily with breakfast.   folic acid 1 MG tablet Commonly known as:  FOLVITE Take 1 tablet (1 mg total) by mouth daily.   MULTIVITAMIN ADULT PO Take by mouth daily.   vitamin C with rose hips 500 MG tablet Take 500 mg by mouth daily.       Allergies:  Allergies  Allergen Reactions  . Latex Rash    Past Medical History, Surgical history, Social history, and Family History were reviewed and updated.  Review of Systems: All other 10 point review of systems is negative.   Physical Exam:  weight is 170 lb (77.1 kg). Her oral temperature is 97.1 F (36.2 C)  (abnormal). Her blood pressure is 135/80 and her pulse is 96. Her respiration is 18.   Wt Readings from Last 3 Encounters:  04/25/18 170 lb (77.1 kg)  02/23/18 170 lb 6.4 oz (77.3 kg)  01/12/18 170 lb (77.1 kg)    Ocular: Sclerae unicteric, pupils equal, round and reactive to light Ear-nose-throat: Oropharynx clear, dentition fair Lymphatic: No cervical, supraclavicular or axillary adenopathy Lungs no rales or rhonchi, good excursion bilaterally Heart regular rate and rhythm, no murmur appreciated Abd soft, nontender, positive bowel sounds, no liver or spleen tip palpated on exam, no fluid wave  MSK no focal spinal tenderness, no joint edema Neuro: non-focal, well-oriented, appropriate affect Breasts: Deferred   Lab Results  Component Value Date   WBC 6.2 04/25/2018   HGB 13.2 04/25/2018   HCT 40.6 04/25/2018   MCV 85.3 04/25/2018   PLT 305 04/25/2018   Lab Results  Component Value Date   FERRITIN 34 02/23/2018   IRON 86 02/23/2018   TIBC 273 02/23/2018   UIBC 186 02/23/2018   IRONPCTSAT 32 02/23/2018   Lab Results  Component Value Date   RBC 4.76 04/25/2018   No results found for: KPAFRELGTCHN, LAMBDASER, KAPLAMBRATIO No results found for: IGGSERUM, IGA, IGMSERUM No results found for: TOTALPROTELP, ALBUMINELP, A1GS, A2GS, BETS, BETA2SER, [place], MSPIKE, SPEI  Chemistry      Component Value Date/Time   NA 139 06/28/2017 0850   NA 142 03/31/2017 0834   K 3.9 06/28/2017 0850   K 3.5 03/31/2017 0834   CL 107 06/28/2017 0850   CL 106 03/31/2017 0834   CO2 25 06/28/2017 0850   CO2 28 03/31/2017 0834   BUN 12 06/28/2017 0850   BUN 9 03/31/2017 0834   CREATININE 0.94 06/28/2017 0850   CREATININE 0.9 03/31/2017 0834      Component Value Date/Time   CALCIUM 9.5 06/28/2017 0850   CALCIUM 9.0 03/31/2017 0834   ALKPHOS 44 06/28/2017 0850   ALKPHOS 50 03/31/2017 0834   AST 15 06/28/2017 0850   ALT 14 06/28/2017 0850   ALT 20 03/31/2017 0834   BILITOT 0.4 06/28/2017  0850       Impression and Plan: Carrie Maynard is a very pleasant 37 yo African American female with both iron deficiency anemia secondary to heavy cycles and the sickle cell trait.  She continues to do well and has no complaints at this time. Iron studies are pending.  I have refilled both her iron and folic acid supplements.  We will plan to see her back in another 3 months for follow-up.  She will contact our office with any questions or concerns. We can certainly see her sooner if need be.   Laverna Peace, NP 12/23/201911:59 AM

## 2018-04-26 LAB — IRON AND TIBC
IRON: 106 ug/dL (ref 41–142)
SATURATION RATIOS: 33 % (ref 21–57)
TIBC: 323 ug/dL (ref 236–444)
UIBC: 216 ug/dL (ref 120–384)

## 2018-04-26 LAB — FERRITIN: Ferritin: 10 ng/mL — ABNORMAL LOW (ref 11–307)

## 2018-04-26 LAB — ERYTHROPOIETIN: Erythropoietin: 13.7 m[IU]/mL (ref 2.6–18.5)

## 2018-06-01 DIAGNOSIS — Z23 Encounter for immunization: Secondary | ICD-10-CM | POA: Diagnosis not present

## 2018-06-01 DIAGNOSIS — L821 Other seborrheic keratosis: Secondary | ICD-10-CM | POA: Diagnosis not present

## 2018-06-01 DIAGNOSIS — B079 Viral wart, unspecified: Secondary | ICD-10-CM | POA: Diagnosis not present

## 2018-07-25 ENCOUNTER — Inpatient Hospital Stay: Payer: 59 | Admitting: Family

## 2018-07-25 ENCOUNTER — Inpatient Hospital Stay: Payer: 59

## 2018-08-22 ENCOUNTER — Inpatient Hospital Stay: Payer: 59 | Admitting: Family

## 2018-08-22 ENCOUNTER — Inpatient Hospital Stay: Payer: 59 | Attending: Family

## 2018-09-22 DIAGNOSIS — Z01411 Encounter for gynecological examination (general) (routine) with abnormal findings: Secondary | ICD-10-CM | POA: Diagnosis not present

## 2018-09-22 DIAGNOSIS — N920 Excessive and frequent menstruation with regular cycle: Secondary | ICD-10-CM | POA: Diagnosis not present

## 2018-09-22 DIAGNOSIS — D500 Iron deficiency anemia secondary to blood loss (chronic): Secondary | ICD-10-CM | POA: Diagnosis not present

## 2018-09-29 ENCOUNTER — Other Ambulatory Visit: Payer: Self-pay | Admitting: Family Medicine

## 2018-09-29 DIAGNOSIS — Z1231 Encounter for screening mammogram for malignant neoplasm of breast: Secondary | ICD-10-CM

## 2018-11-21 ENCOUNTER — Other Ambulatory Visit: Payer: 59

## 2018-11-21 ENCOUNTER — Ambulatory Visit: Payer: 59 | Admitting: Family

## 2018-11-24 ENCOUNTER — Other Ambulatory Visit: Payer: Self-pay

## 2018-11-24 ENCOUNTER — Telehealth: Payer: Self-pay | Admitting: Family

## 2018-11-24 ENCOUNTER — Inpatient Hospital Stay (HOSPITAL_BASED_OUTPATIENT_CLINIC_OR_DEPARTMENT_OTHER): Payer: 59 | Admitting: Family

## 2018-11-24 ENCOUNTER — Inpatient Hospital Stay: Payer: 59 | Attending: Family

## 2018-11-24 DIAGNOSIS — D573 Sickle-cell trait: Secondary | ICD-10-CM

## 2018-11-24 DIAGNOSIS — Z79899 Other long term (current) drug therapy: Secondary | ICD-10-CM

## 2018-11-24 DIAGNOSIS — D500 Iron deficiency anemia secondary to blood loss (chronic): Secondary | ICD-10-CM | POA: Insufficient documentation

## 2018-11-24 DIAGNOSIS — N920 Excessive and frequent menstruation with regular cycle: Secondary | ICD-10-CM | POA: Diagnosis not present

## 2018-11-24 LAB — CBC WITH DIFFERENTIAL (CANCER CENTER ONLY)
Abs Immature Granulocytes: 0.02 10*3/uL (ref 0.00–0.07)
Basophils Absolute: 0 10*3/uL (ref 0.0–0.1)
Basophils Relative: 1 %
Eosinophils Absolute: 0.1 10*3/uL (ref 0.0–0.5)
Eosinophils Relative: 1 %
HCT: 40.7 % (ref 36.0–46.0)
Hemoglobin: 13.3 g/dL (ref 12.0–15.0)
Immature Granulocytes: 0 %
Lymphocytes Relative: 37 %
Lymphs Abs: 1.8 10*3/uL (ref 0.7–4.0)
MCH: 27.3 pg (ref 26.0–34.0)
MCHC: 32.7 g/dL (ref 30.0–36.0)
MCV: 83.4 fL (ref 80.0–100.0)
Monocytes Absolute: 0.4 10*3/uL (ref 0.1–1.0)
Monocytes Relative: 9 %
Neutro Abs: 2.4 10*3/uL (ref 1.7–7.7)
Neutrophils Relative %: 52 %
Platelet Count: 249 10*3/uL (ref 150–400)
RBC: 4.88 MIL/uL (ref 3.87–5.11)
RDW: 13.3 % (ref 11.5–15.5)
WBC Count: 4.8 10*3/uL (ref 4.0–10.5)
nRBC: 0 % (ref 0.0–0.2)

## 2018-11-24 LAB — IRON AND TIBC
Iron: 129 ug/dL (ref 41–142)
Saturation Ratios: 37 % (ref 21–57)
TIBC: 344 ug/dL (ref 236–444)
UIBC: 215 ug/dL (ref 120–384)

## 2018-11-24 LAB — RETICULOCYTES
Immature Retic Fract: 12.8 % (ref 2.3–15.9)
RBC.: 4.81 MIL/uL (ref 3.87–5.11)
Retic Count, Absolute: 67.8 10*3/uL (ref 19.0–186.0)
Retic Ct Pct: 1.4 % (ref 0.4–3.1)

## 2018-11-24 LAB — FERRITIN: Ferritin: 13 ng/mL (ref 11–307)

## 2018-11-24 MED ORDER — FOLIC ACID 1 MG PO TABS: 1.0000 mg | ORAL_TABLET | Freq: Every day | ORAL | 11 refills | Status: AC

## 2018-11-24 MED FILL — FOLIC ACID 1 MG TABS: 1 | 30 days supply | Qty: 30 | Fill #0

## 2018-11-24 NOTE — Progress Notes (Signed)
Hematology and Oncology Follow Up Visit  Carrie Maynard [id number] August 23, 1980 38 y.o. 11/24/2018   Principle Diagnosis:  Iron deficiency anemia Sickle cell trait  Current Therapy:   Folic acid 1 mg PO daily PO iron supplement daily   Interim History:  Carrie Maynard forgot her appointment time and came 2 hours early so she had to leave prior to her 10:45 appointment to go take care of her children. We were able to have a telephone visit today instead which worked out well.  She is doing good and has no complaints at this time.  She states that her cycle is heavy the first 3 days and lasts a total of 5. She has not noted any other bleeding.  No bruising or petechiae.  No fever, chills, n/v, cough, rash, dizziness, SOB, chest pain, palpitations, abdominal pain or changes in bowel or bladder habits.  No swelling, tenderness, numbness or tingling in her extremities.  She is eating an iron rich diet and is staying well hydrated. Her weight is stable.   ECOG Performance Status: 0 - Asymptomatic  Medications:  Allergies as of 11/24/2018      Reactions   Latex Rash      Medication List       Accurate as of November 24, 2018 10:49 AM. If you have any questions, ask your nurse or doctor.        APPLE CIDER VINEGAR PO Take 2 tablets by mouth daily.   D3 VITAMIN PO Take 2,500 Units by mouth daily.   ferrous gluconate 324 MG tablet Commonly known as: FERGON Take 1 tablet (324 mg total) by mouth daily with breakfast.   folic acid 1 MG tablet Commonly known as: FOLVITE Take 1 tablet (1 mg total) by mouth daily.   MULTIVITAMIN ADULT PO Take by mouth daily.   vitamin C with rose hips 500 MG tablet Take 500 mg by mouth daily.       Allergies:  Allergies  Allergen Reactions  . Latex Rash    Past Medical History, Surgical history, Social history, and Family History were reviewed and updated.  Review of Systems: All other 10 point review of systems is negative.   Physical  Exam:  vitals were not taken for this visit.  Telephone Visit  Wt Readings from Last 3 Encounters:  04/25/18 170 lb (77.1 kg)  02/23/18 170 lb 6.4 oz (77.3 kg)  01/12/18 170 lb (77.1 kg)    Lab Results  Component Value Date   WBC 4.8 11/24/2018   HGB 13.3 11/24/2018   HCT 40.7 11/24/2018   MCV 83.4 11/24/2018   PLT 249 11/24/2018   Lab Results  Component Value Date   FERRITIN 10 (L) 04/25/2018   IRON 106 04/25/2018   TIBC 323 04/25/2018   UIBC 216 04/25/2018   IRONPCTSAT 33 04/25/2018   Lab Results  Component Value Date   RETICCTPCT 1.4 11/24/2018   RBC 4.81 11/24/2018   RBC 4.88 11/24/2018   No results found for: KPAFRELGTCHN, LAMBDASER, KAPLAMBRATIO No results found for: IGGSERUM, IGA, IGMSERUM No results found for: Odetta Pink, SPEI   Chemistry      Component Value Date/Time   NA 139 06/28/2017 0850   NA 142 03/31/2017 0834   K 3.9 06/28/2017 0850   K 3.5 03/31/2017 0834   CL 107 06/28/2017 0850   CL 106 03/31/2017 0834   CO2 25 06/28/2017 0850   CO2 28 03/31/2017 0834   BUN  12 06/28/2017 0850   BUN 9 03/31/2017 0834   CREATININE 0.94 06/28/2017 0850   CREATININE 0.9 03/31/2017 0834      Component Value Date/Time   CALCIUM 9.5 06/28/2017 0850   CALCIUM 9.0 03/31/2017 0834   ALKPHOS 44 06/28/2017 0850   ALKPHOS 50 03/31/2017 0834   AST 15 06/28/2017 0850   ALT 14 06/28/2017 0850   ALT 20 03/31/2017 0834   BILITOT 0.4 06/28/2017 0850       Impression and Plan: Carrie Maynard is a very pleasant 38 yo African American female with both iron deficiency anemia secondary to heavy cycles and the sickle cell trait. We will see what her iron studies show and bring her back in for infusion if needed.  I did refill her folic acid today.  We will plan to see her back in another 4 months.  She will contact our office with any questions or concerns. We can certainly see her sooner if needed.    Laverna Peace,  NP 7/23/202010:49 AM

## 2018-11-24 NOTE — Telephone Encounter (Signed)
Called and notified patient of appointments added per 7/23 los

## 2018-12-16 DIAGNOSIS — F418 Other specified anxiety disorders: Secondary | ICD-10-CM | POA: Diagnosis not present

## 2018-12-16 DIAGNOSIS — F819 Developmental disorder of scholastic skills, unspecified: Secondary | ICD-10-CM | POA: Diagnosis not present

## 2019-01-05 ENCOUNTER — Ambulatory Visit
Admission: RE | Admit: 2019-01-05 | Discharge: 2019-01-05 | Disposition: A | Discharge status: Home / Self Care | Payer: 59 | Source: Ambulatory Visit | Attending: Family Medicine | Admitting: Family Medicine

## 2019-01-05 ENCOUNTER — Other Ambulatory Visit: Payer: Self-pay

## 2019-01-05 DIAGNOSIS — Z1231 Encounter for screening mammogram for malignant neoplasm of breast: Secondary | ICD-10-CM

## 2019-01-23 DIAGNOSIS — Z8742 Personal history of other diseases of the female genital tract: Secondary | ICD-10-CM | POA: Diagnosis not present

## 2019-01-23 DIAGNOSIS — R102 Pelvic and perineal pain: Secondary | ICD-10-CM | POA: Diagnosis not present

## 2019-01-23 DIAGNOSIS — M545 Low back pain: Secondary | ICD-10-CM | POA: Diagnosis not present

## 2019-01-24 DIAGNOSIS — R102 Pelvic and perineal pain: Secondary | ICD-10-CM | POA: Diagnosis not present

## 2019-01-25 DIAGNOSIS — Z0000 Encounter for general adult medical examination without abnormal findings: Secondary | ICD-10-CM | POA: Diagnosis not present

## 2019-01-25 DIAGNOSIS — Z5181 Encounter for therapeutic drug level monitoring: Secondary | ICD-10-CM | POA: Diagnosis not present

## 2019-01-25 DIAGNOSIS — R399 Unspecified symptoms and signs involving the genitourinary system: Secondary | ICD-10-CM | POA: Diagnosis not present

## 2019-01-25 DIAGNOSIS — Z13220 Encounter for screening for lipoid disorders: Secondary | ICD-10-CM | POA: Diagnosis not present

## 2019-03-27 ENCOUNTER — Ambulatory Visit: Payer: 59 | Admitting: Family

## 2019-03-27 ENCOUNTER — Telehealth: Payer: Self-pay | Admitting: Family

## 2019-03-27 ENCOUNTER — Other Ambulatory Visit: Payer: 59

## 2019-03-27 NOTE — Telephone Encounter (Signed)
Called and LMVM for patient to call back to rescheduled 11/24 appt unless she was symptomatic

## 2019-03-28 ENCOUNTER — Inpatient Hospital Stay: Payer: 59 | Admitting: Family

## 2019-03-28 ENCOUNTER — Inpatient Hospital Stay: Payer: 59

## 2019-03-29 DIAGNOSIS — D573 Sickle-cell trait: Secondary | ICD-10-CM | POA: Diagnosis not present

## 2019-03-29 DIAGNOSIS — Z0000 Encounter for general adult medical examination without abnormal findings: Secondary | ICD-10-CM | POA: Diagnosis not present

## 2019-03-29 DIAGNOSIS — R946 Abnormal results of thyroid function studies: Secondary | ICD-10-CM | POA: Diagnosis not present

## 2019-03-29 DIAGNOSIS — Z862 Personal history of diseases of the blood and blood-forming organs and certain disorders involving the immune mechanism: Secondary | ICD-10-CM | POA: Diagnosis not present

## 2019-03-29 DIAGNOSIS — R7309 Other abnormal glucose: Secondary | ICD-10-CM | POA: Diagnosis not present

## 2019-03-29 DIAGNOSIS — Z7189 Other specified counseling: Secondary | ICD-10-CM | POA: Diagnosis not present

## 2019-06-06 DIAGNOSIS — R1030 Lower abdominal pain, unspecified: Secondary | ICD-10-CM | POA: Diagnosis not present

## 2019-06-12 ENCOUNTER — Other Ambulatory Visit: Payer: Self-pay | Admitting: Surgery

## 2019-06-12 DIAGNOSIS — R109 Unspecified abdominal pain: Secondary | ICD-10-CM

## 2019-06-13 DIAGNOSIS — I8312 Varicose veins of left lower extremity with inflammation: Secondary | ICD-10-CM | POA: Diagnosis not present

## 2019-06-13 DIAGNOSIS — M79605 Pain in left leg: Secondary | ICD-10-CM | POA: Diagnosis not present

## 2019-06-21 ENCOUNTER — Other Ambulatory Visit: Payer: 59

## 2019-09-28 DIAGNOSIS — Z01419 Encounter for gynecological examination (general) (routine) without abnormal findings: Secondary | ICD-10-CM | POA: Diagnosis not present

## 2019-12-19 ENCOUNTER — Other Ambulatory Visit: Payer: Self-pay

## 2019-12-19 ENCOUNTER — Other Ambulatory Visit: Payer: 59

## 2019-12-19 DIAGNOSIS — Z20822 Contact with and (suspected) exposure to covid-19: Secondary | ICD-10-CM

## 2019-12-20 LAB — SARS-COV-2, NAA 2 DAY TAT

## 2019-12-20 LAB — NOVEL CORONAVIRUS, NAA: SARS-CoV-2, NAA: NOT DETECTED

## 2020-01-30 DIAGNOSIS — R946 Abnormal results of thyroid function studies: Secondary | ICD-10-CM | POA: Diagnosis not present

## 2020-01-30 DIAGNOSIS — R7309 Other abnormal glucose: Secondary | ICD-10-CM | POA: Diagnosis not present

## 2020-01-30 DIAGNOSIS — Z0000 Encounter for general adult medical examination without abnormal findings: Secondary | ICD-10-CM | POA: Diagnosis not present

## 2020-02-05 ENCOUNTER — Other Ambulatory Visit: Payer: Self-pay | Admitting: Family Medicine

## 2020-02-05 DIAGNOSIS — R7309 Other abnormal glucose: Secondary | ICD-10-CM | POA: Diagnosis not present

## 2020-02-05 DIAGNOSIS — R946 Abnormal results of thyroid function studies: Secondary | ICD-10-CM | POA: Diagnosis not present

## 2020-02-05 DIAGNOSIS — Z1231 Encounter for screening mammogram for malignant neoplasm of breast: Secondary | ICD-10-CM

## 2020-02-05 DIAGNOSIS — I10 Essential (primary) hypertension: Secondary | ICD-10-CM | POA: Diagnosis not present

## 2020-02-05 DIAGNOSIS — Z0000 Encounter for general adult medical examination without abnormal findings: Secondary | ICD-10-CM | POA: Diagnosis not present

## 2020-02-07 DIAGNOSIS — H5211 Myopia, right eye: Secondary | ICD-10-CM | POA: Diagnosis not present

## 2020-02-07 DIAGNOSIS — H52223 Regular astigmatism, bilateral: Secondary | ICD-10-CM | POA: Diagnosis not present

## 2020-02-19 ENCOUNTER — Other Ambulatory Visit (HOSPITAL_COMMUNITY): Payer: Self-pay | Admitting: Family Medicine

## 2020-02-19 MED FILL — AMLODIPINE BESYLATE 5 MG TA: 5 | 30 days supply | Qty: 30 | Fill #0

## 2020-03-05 ENCOUNTER — Ambulatory Visit
Admission: RE | Admit: 2020-03-05 | Discharge: 2020-03-05 | Disposition: A | Discharge status: Home / Self Care | Payer: 59 | Source: Ambulatory Visit | Attending: Family Medicine | Admitting: Family Medicine

## 2020-03-05 ENCOUNTER — Other Ambulatory Visit: Payer: Self-pay

## 2020-03-05 DIAGNOSIS — Z1231 Encounter for screening mammogram for malignant neoplasm of breast: Secondary | ICD-10-CM | POA: Diagnosis not present

## 2020-03-11 ENCOUNTER — Other Ambulatory Visit (HOSPITAL_COMMUNITY): Payer: Self-pay | Admitting: Family Medicine

## 2020-03-11 DIAGNOSIS — R000 Tachycardia, unspecified: Secondary | ICD-10-CM | POA: Diagnosis not present

## 2020-03-11 DIAGNOSIS — I10 Essential (primary) hypertension: Secondary | ICD-10-CM | POA: Diagnosis not present

## 2020-03-11 MED FILL — BISOPROLOL FUMARATE 5 MG TA: 5 | 30 days supply | Qty: 30 | Fill #0

## 2020-03-17 ENCOUNTER — Ambulatory Visit: Payer: 59 | Attending: Internal Medicine

## 2020-03-17 DIAGNOSIS — Z23 Encounter for immunization: Secondary | ICD-10-CM

## 2020-03-17 NOTE — Progress Notes (Signed)
   Covid-19 Vaccination Clinic  Name:  Carrie Maynard    MRN: [id number] DOB: 1981/01/13  03/17/2020  [name] was observed post Covid-19 immunization for 15 minutes without incident. She was provided with Vaccine Information Sheet and instruction to access the V-Safe system.   [name] was instructed to call 911 with any severe reactions post vaccine: Marland Kitchen Difficulty breathing  . Swelling of face and throat  . A fast heartbeat  . A bad rash all over body  . Dizziness and weakness   Immunizations Administered    Name Date Dose VIS Date Route   Pfizer COVID-19 Vaccine 03/17/2020  2:02 PM 0.3 mL 02/21/2020 Intramuscular   Manufacturer: [place]   Lot: [id number]   [place]: [id number]

## 2020-03-21 DIAGNOSIS — R000 Tachycardia, unspecified: Secondary | ICD-10-CM | POA: Diagnosis not present

## 2020-03-21 DIAGNOSIS — I10 Essential (primary) hypertension: Secondary | ICD-10-CM | POA: Diagnosis not present

## 2020-04-08 MED FILL — BISOPROLOL FUMARATE 5 MG TA: 5 | 30 days supply | Qty: 30 | Fill #1

## 2020-05-17 ENCOUNTER — Other Ambulatory Visit (HOSPITAL_COMMUNITY): Payer: Self-pay

## 2020-05-17 MED FILL — BISOPROLOL FUMARATE 5 MG TA: 5 | 30 days supply | Qty: 30 | Fill #0

## 2020-05-22 ENCOUNTER — Other Ambulatory Visit (HOSPITAL_COMMUNITY): Payer: Self-pay | Admitting: Family Medicine

## 2020-05-22 MED FILL — FLUCONAZOLE 150 MG TABS: 150 | 3 days supply | Qty: 3 | Fill #0

## 2020-06-21 MED FILL — BISOPROLOL FUMARATE 5 MG TA: 5 | 30 days supply | Qty: 30 | Fill #1

## 2020-07-25 ENCOUNTER — Other Ambulatory Visit (HOSPITAL_COMMUNITY): Payer: Self-pay | Admitting: Family Medicine

## 2020-07-25 DIAGNOSIS — R7309 Other abnormal glucose: Secondary | ICD-10-CM | POA: Diagnosis not present

## 2020-07-25 DIAGNOSIS — Z13220 Encounter for screening for lipoid disorders: Secondary | ICD-10-CM | POA: Diagnosis not present

## 2020-07-25 DIAGNOSIS — I10 Essential (primary) hypertension: Secondary | ICD-10-CM | POA: Diagnosis not present

## 2020-07-25 DIAGNOSIS — R946 Abnormal results of thyroid function studies: Secondary | ICD-10-CM | POA: Diagnosis not present

## 2020-07-25 DIAGNOSIS — Z862 Personal history of diseases of the blood and blood-forming organs and certain disorders involving the immune mechanism: Secondary | ICD-10-CM | POA: Diagnosis not present

## 2020-07-25 DIAGNOSIS — Z79899 Other long term (current) drug therapy: Secondary | ICD-10-CM | POA: Diagnosis not present

## 2020-07-25 DIAGNOSIS — R1033 Periumbilical pain: Secondary | ICD-10-CM | POA: Diagnosis not present

## 2020-07-25 MED FILL — BISOPROLOL FUMARATE 5 MG TA: 5 | 90 days supply | Qty: 90 | Fill #0

## 2020-07-26 ENCOUNTER — Other Ambulatory Visit (HOSPITAL_COMMUNITY): Payer: Self-pay | Admitting: Student

## 2020-07-26 ENCOUNTER — Other Ambulatory Visit: Payer: Self-pay | Admitting: Student

## 2020-07-26 ENCOUNTER — Other Ambulatory Visit: Payer: Self-pay

## 2020-07-26 ENCOUNTER — Ambulatory Visit (HOSPITAL_COMMUNITY)
Admission: RE | Admit: 2020-07-26 | Discharge: 2020-07-26 | Disposition: A | Discharge status: Home / Self Care | Payer: 59 | Source: Ambulatory Visit | Attending: Student | Admitting: Student

## 2020-07-26 DIAGNOSIS — R1033 Periumbilical pain: Secondary | ICD-10-CM | POA: Diagnosis not present

## 2020-07-26 DIAGNOSIS — R1084 Generalized abdominal pain: Secondary | ICD-10-CM | POA: Diagnosis not present

## 2020-07-26 DIAGNOSIS — R222 Localized swelling, mass and lump, trunk: Secondary | ICD-10-CM | POA: Diagnosis not present

## 2020-07-26 LAB — POCT I-STAT CREATININE: Creatinine, Ser: 1 mg/dL (ref 0.44–1.00)

## 2020-07-26 MED ORDER — IOHEXOL 300 MG/ML  SOLN
100.0000 mL | Freq: Once | INTRAMUSCULAR | Status: AC | PRN
  Administered 2020-07-26: 100 mL via INTRAVENOUS

## 2020-08-05 ENCOUNTER — Other Ambulatory Visit: Payer: Self-pay | Admitting: Surgery

## 2020-08-05 DIAGNOSIS — R1909 Other intra-abdominal and pelvic swelling, mass and lump: Secondary | ICD-10-CM | POA: Diagnosis not present

## 2020-08-15 DIAGNOSIS — R1909 Other intra-abdominal and pelvic swelling, mass and lump: Secondary | ICD-10-CM | POA: Diagnosis not present

## 2020-08-27 ENCOUNTER — Other Ambulatory Visit: Payer: Self-pay

## 2020-08-27 ENCOUNTER — Encounter (HOSPITAL_BASED_OUTPATIENT_CLINIC_OR_DEPARTMENT_OTHER): Payer: Self-pay | Admitting: Surgery

## 2020-09-02 ENCOUNTER — Encounter (HOSPITAL_BASED_OUTPATIENT_CLINIC_OR_DEPARTMENT_OTHER)
Admission: RE | Admit: 2020-09-02 | Discharge: 2020-09-02 | Disposition: A | Discharge status: Home / Self Care | Payer: 59 | Source: Ambulatory Visit | Attending: Surgery | Admitting: Surgery

## 2020-09-02 ENCOUNTER — Other Ambulatory Visit (HOSPITAL_COMMUNITY)
Admission: RE | Admit: 2020-09-02 | Discharge: 2020-09-02 | Disposition: A | Discharge status: Home / Self Care | Payer: 59 | Source: Ambulatory Visit | Attending: Surgery | Admitting: Surgery

## 2020-09-02 DIAGNOSIS — Z20822 Contact with and (suspected) exposure to covid-19: Secondary | ICD-10-CM | POA: Diagnosis not present

## 2020-09-02 DIAGNOSIS — Z01818 Encounter for other preprocedural examination: Secondary | ICD-10-CM | POA: Insufficient documentation

## 2020-09-02 NOTE — Pre-Procedure Instructions (Signed)

## 2020-09-03 LAB — SARS CORONAVIRUS 2 (TAT 6-24 HRS): SARS Coronavirus 2: NEGATIVE

## 2020-09-03 NOTE — Anesthesia Preprocedure Evaluation (Addendum)
Anesthesia Evaluation  Patient identified by MRN, date of birth, ID band Patient awake    Reviewed: Allergy & Precautions, NPO status , Patient's Chart, lab work & pertinent test results, reviewed documented beta blocker date and time   History of Anesthesia Complications Negative for: history of anesthetic complications  Airway Mallampati: II  TM Distance: >3 FB Neck ROM: Full    Dental no notable dental hx.    Pulmonary neg pulmonary ROS,    Pulmonary exam normal        Cardiovascular hypertension, Pt. on home beta blockers and Pt. on medications Normal cardiovascular exam     Neuro/Psych negative neurological ROS  negative psych ROS   GI/Hepatic Neg liver ROS, Umbilical mass   Endo/Other  negative endocrine ROS  Renal/GU negative Renal ROS  negative genitourinary   Musculoskeletal negative musculoskeletal ROS (+)   Abdominal   Peds  Hematology  (+) Sickle cell trait ,   Anesthesia Other Findings Day of surgery medications reviewed with patient.  Reproductive/Obstetrics negative OB ROS                            Anesthesia Physical Anesthesia Plan  ASA: II  Anesthesia Plan: General   Post-op Pain Management:    Induction: Intravenous  PONV Risk Score and Plan: 3 and Treatment may vary due to age or medical condition, Ondansetron, Dexamethasone, Midazolam and Scopolamine patch - Pre-op  Airway Management Planned: LMA  Additional Equipment: None  Intra-op Plan:   Post-operative Plan: Extubation in OR  Informed Consent: I have reviewed the patients History and Physical, chart, labs and discussed the procedure including the risks, benefits and alternatives for the proposed anesthesia with the patient or authorized representative who has indicated his/her understanding and acceptance.     Dental advisory given  Plan Discussed with: CRNA  Anesthesia Plan Comments:        Anesthesia Quick Evaluation

## 2020-09-03 NOTE — H&P (Signed)
Carrie Maynard  Location: [facility] Surgery Patient #: [id number] DOB: [birth year] Married / Language: English / Race: Black or African American Female   History of Present Illness  The patient is a 40 year old female who presents for an evaluation of a hernia. This is a pleasant 40 year old female who is a self-referral for a possible abdominal wall hernia. She has a history of anemia and sickle cell trait. She reports having had a small ventral hernia repaired as a child. She then had an abdominoplasty at the time was found to have several hernias at her old C-section incision which were repaired. She is uncertain whether mesh was used. Recently, she's been having increased pain and swelling to the left of the umbilicus. She reports that the pain is sharp and worse with lifting. She has had no nausea or vomiting. She notices a fullness or occasional bulge to the left of the umbilicus as well.   She was seen in the office one year ago and  CT scan was ordered which she did not get done. She is here  for request of her PCP who saw her for umbilical bleeding earlier this week. She states the area bled with movement for 3 days. It has not bled in the last few days. 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 She states pain occurs with movement and any exercise that engages her core. She denies fevers, nausea, vomiting, or issues with her bowels. She is a Marine scientist.   History reviewed. No pertinent past medical history.      Past Surgical History  Procedure Laterality Date  . Hernia  04/2010  . Tummy tuck  04/2010  . Hernia repair    . Cesarean section      Allergies  Latex  Allergies Reconciled   Medication History  Folic Acid (1MG  Tablet, Oral) Active. Ferrous Gluconate (324 (38 Fe)MG Tablet, Oral) Active. Vitamin A-25000/D-400 (25000-400UNIT Capsule, Oral) Active. Medications Reconciled    Review of Systems  General  Not Present- Appetite Loss, Chills, Fatigue, Fever, Night Sweats, Weight Gain and Weight Loss. Skin Not Present- Change in Wart/Mole, Dryness, Hives, Jaundice, New Lesions, Non-Healing Wounds, Rash and Ulcer. HEENT Not Present- Earache, Hearing Loss, Hoarseness, Nose Bleed, Oral Ulcers, Ringing in the Ears, Seasonal Allergies, Sinus Pain, Sore Throat, Visual Disturbances, Wears glasses/contact lenses and Yellow Eyes. Respiratory Not Present- Bloody sputum, Chronic Cough, Difficulty Breathing, Snoring and Wheezing. Breast Not Present- Breast Mass, Breast Pain, Nipple Discharge and Skin Changes. Cardiovascular Not Present- Chest Pain, Difficulty Breathing Lying Down, Leg Cramps, Palpitations, Rapid Heart Rate, Shortness of Breath and Swelling of Extremities. Gastrointestinal Present- Abdominal Pain. Not Present- Bloating, Bloody Stool, Change in Bowel Habits, Chronic diarrhea, Constipation, Difficulty Swallowing, Excessive gas, Gets full quickly at meals, Hemorrhoids, Indigestion, Nausea, Rectal Pain and Vomiting. Female Genitourinary Not Present- Frequency, Nocturia, Painful Urination, Pelvic Pain and Urgency. Musculoskeletal Not Present- Back Pain, Joint Pain, Joint Stiffness, Muscle Pain, Muscle Weakness and Swelling of Extremities. Neurological Not Present- Decreased Memory, Fainting, Headaches, Numbness, Seizures, Tingling, Tremor, Trouble walking and Weakness. Psychiatric Not Present- Anxiety, Bipolar, Change in Sleep Pattern, Depression, Fearful and Frequent crying. Endocrine Not Present- Cold Intolerance, Excessive Hunger, Hair Changes, Heat Intolerance, Hot flashes and New Diabetes. Hematology Not Present- Blood Thinners, Easy Bruising, Excessive bleeding, Gland problems, HIV and Persistent Infections.  Vitals  Weight: 193.5 lb Height: 67in Body Surface Area: 1.99 m Body Mass Index: 30.31 kg/m  Temp.: 98.71F  Pulse:  86 (Regular)  P.OX: 97% (Room air) BP: 114/72(Sitting, Left  Arm, Standard)       Physical Exam  The physical exam findings are as follows: Note: On examination, she has normal appearance and appears relaxed  Her abdomen is soft. Her abdominoplasty incision is well-healed. The neoumbilicus has a 1 cm area of tissue that is dark, photo was taken and placed in Epic No active bleeding today Some tenderness to palpation around the umbilicus There are no hernias in the lower abdomen.  Lungs clear CV RRR Skin otherwise normal Neuro grossly intact Psych normal    Assessment & Plan  ABDOMINAL PAIN, ACUTE (R10.9)  Impression: She is presenting to the office with bleeding from the umbilicus x 3 days. On exam today, there are no signs of infection or active bleeding. She does have an area that is very dark in the center of her neoumbilicus. I arecommend getting the CT scan that was ordered one year ago. This will be reordered to rule out a hernia, and she will call if she develops any worsening issues in the meantime.  Addendum: I reviewed the CT scan of her abdomen and pelvis. This is a slightly less than 2 cm mass in the subcutaneous tissue of the umbilicus with communication to the skin. This may represent an old stitch granuloma for an endometrioma. Because of her ongoing symptoms and bleeding, surgical excision of this is recommended. I discussed this with her in detail. I discussed the surgical procedure. I discussed the risks which includes but is not limited to bleeding, infection, recurrence, the need for further procedures, the need to completely remove her umbilicus and reconstruct it, cardiopulmonary issues, postoperative recovery, etc. She understands and wishes to proceed with surgery which will be scheduled.

## 2020-09-04 ENCOUNTER — Ambulatory Visit (HOSPITAL_BASED_OUTPATIENT_CLINIC_OR_DEPARTMENT_OTHER): Payer: 59 | Admitting: Anesthesiology

## 2020-09-04 ENCOUNTER — Other Ambulatory Visit: Payer: Self-pay

## 2020-09-04 ENCOUNTER — Ambulatory Visit (HOSPITAL_COMMUNITY)
Admission: RE | Admit: 2020-09-04 | Discharge: 2020-09-04 | Disposition: A | Discharge status: Home / Self Care | Payer: 59 | Attending: Surgery | Admitting: Surgery

## 2020-09-04 ENCOUNTER — Other Ambulatory Visit (HOSPITAL_COMMUNITY): Payer: Self-pay

## 2020-09-04 ENCOUNTER — Encounter (HOSPITAL_BASED_OUTPATIENT_CLINIC_OR_DEPARTMENT_OTHER): Payer: Self-pay | Admitting: Surgery

## 2020-09-04 ENCOUNTER — Encounter (HOSPITAL_BASED_OUTPATIENT_CLINIC_OR_DEPARTMENT_OTHER)
Admission: RE | Disposition: A | Discharge status: Home / Self Care | Payer: Self-pay | Source: Home / Self Care | Attending: Surgery

## 2020-09-04 DIAGNOSIS — Z8719 Personal history of other diseases of the digestive system: Secondary | ICD-10-CM | POA: Insufficient documentation

## 2020-09-04 DIAGNOSIS — D573 Sickle-cell trait: Secondary | ICD-10-CM | POA: Diagnosis not present

## 2020-09-04 DIAGNOSIS — R1909 Other intra-abdominal and pelvic swelling, mass and lump: Secondary | ICD-10-CM | POA: Diagnosis not present

## 2020-09-04 DIAGNOSIS — D509 Iron deficiency anemia, unspecified: Secondary | ICD-10-CM | POA: Diagnosis not present

## 2020-09-04 DIAGNOSIS — N806 Endometriosis in cutaneous scar: Secondary | ICD-10-CM | POA: Diagnosis not present

## 2020-09-04 DIAGNOSIS — Z91040 Latex allergy status: Secondary | ICD-10-CM | POA: Diagnosis not present

## 2020-09-04 DIAGNOSIS — R1905 Periumbilic swelling, mass or lump: Secondary | ICD-10-CM | POA: Insufficient documentation

## 2020-09-04 DIAGNOSIS — N809 Endometriosis, unspecified: Secondary | ICD-10-CM | POA: Diagnosis not present

## 2020-09-04 DIAGNOSIS — I10 Essential (primary) hypertension: Secondary | ICD-10-CM | POA: Diagnosis not present

## 2020-09-04 DIAGNOSIS — N808 Other endometriosis: Secondary | ICD-10-CM | POA: Diagnosis not present

## 2020-09-04 HISTORY — DX: Other specified postprocedural states: R11.2

## 2020-09-04 HISTORY — DX: Other specified postprocedural states: Z98.890

## 2020-09-04 HISTORY — DX: Other complications of anesthesia, initial encounter: T88.59XA

## 2020-09-04 HISTORY — DX: Essential (primary) hypertension: I10

## 2020-09-04 HISTORY — DX: Anemia, unspecified: D64.9

## 2020-09-04 HISTORY — DX: Sickle-cell trait: D57.3

## 2020-09-04 HISTORY — PX: LIPOMA EXCISION: SHX5283

## 2020-09-04 LAB — POCT PREGNANCY, URINE: Preg Test, Ur: NEGATIVE

## 2020-09-04 SURGERY — EXCISION LIPOMA
Anesthesia: General | Site: Abdomen

## 2020-09-04 MED ORDER — FENTANYL CITRATE (PF) 100 MCG/2ML IJ SOLN
INTRAMUSCULAR | Status: AC
  Filled 2020-09-04: qty 2

## 2020-09-04 MED ORDER — ONDANSETRON HCL 4 MG/2ML IJ SOLN
INTRAMUSCULAR | Status: AC
  Filled 2020-09-04: qty 2

## 2020-09-04 MED ORDER — LACTATED RINGERS IV SOLN: INTRAVENOUS | Status: DC

## 2020-09-04 MED ORDER — FENTANYL CITRATE (PF) 100 MCG/2ML IJ SOLN
25.0000 ug | INTRAMUSCULAR | Status: DC | PRN
  Administered 2020-09-04: 25 ug via INTRAVENOUS

## 2020-09-04 MED ORDER — DEXAMETHASONE SODIUM PHOSPHATE 10 MG/ML IJ SOLN
INTRAMUSCULAR | Status: AC
  Filled 2020-09-04: qty 1

## 2020-09-04 MED ORDER — CEFAZOLIN SODIUM-DEXTROSE 2-4 GM/100ML-% IV SOLN
2.0000 g | INTRAVENOUS | Status: AC
  Administered 2020-09-04: 2 g via INTRAVENOUS

## 2020-09-04 MED ORDER — FENTANYL CITRATE (PF) 100 MCG/2ML IJ SOLN
INTRAMUSCULAR | Status: DC | PRN
  Administered 2020-09-04 (×4): 25 ug via INTRAVENOUS

## 2020-09-04 MED ORDER — CHLORHEXIDINE GLUCONATE CLOTH 2 % EX PADS: 6.0000 | MEDICATED_PAD | Freq: Once | CUTANEOUS | Status: DC

## 2020-09-04 MED ORDER — ACETAMINOPHEN 500 MG PO TABS
1000.0000 mg | ORAL_TABLET | ORAL | Status: AC
  Administered 2020-09-04: 1000 mg via ORAL

## 2020-09-04 MED ORDER — ACETAMINOPHEN 500 MG PO TABS: 1000.0000 mg | ORAL_TABLET | Freq: Once | ORAL | Status: DC

## 2020-09-04 MED ORDER — LIDOCAINE 2% (20 MG/ML) 5 ML SYRINGE
INTRAMUSCULAR | Status: AC
  Filled 2020-09-04: qty 5

## 2020-09-04 MED ORDER — OXYCODONE HCL 5 MG/5ML PO SOLN
5.0000 mg | Freq: Once | ORAL | Status: AC | PRN
Start: 2020-09-04 — End: 2020-09-04

## 2020-09-04 MED ORDER — PROPOFOL 10 MG/ML IV BOLUS
INTRAVENOUS | Status: AC
  Filled 2020-09-04: qty 20

## 2020-09-04 MED ORDER — PROMETHAZINE HCL 25 MG/ML IJ SOLN: 6.2500 mg | INTRAMUSCULAR | Status: DC | PRN

## 2020-09-04 MED ORDER — LIDOCAINE HCL (CARDIAC) PF 100 MG/5ML IV SOSY
PREFILLED_SYRINGE | INTRAVENOUS | Status: DC | PRN
  Administered 2020-09-04: 80 mg via INTRAVENOUS

## 2020-09-04 MED ORDER — CEFAZOLIN SODIUM-DEXTROSE 2-4 GM/100ML-% IV SOLN
INTRAVENOUS | Status: AC
  Filled 2020-09-04: qty 100

## 2020-09-04 MED ORDER — OXYCODONE HCL 5 MG PO TABS
5.0000 mg | ORAL_TABLET | Freq: Once | ORAL | Status: AC | PRN
  Administered 2020-09-04: 5 mg via ORAL

## 2020-09-04 MED ORDER — MIDAZOLAM HCL 2 MG/2ML IJ SOLN
INTRAMUSCULAR | Status: AC
  Filled 2020-09-04: qty 2

## 2020-09-04 MED ORDER — BUPIVACAINE-EPINEPHRINE 0.5% -1:200000 IJ SOLN
INTRAMUSCULAR | Status: DC | PRN
  Administered 2020-09-04: 10 mL

## 2020-09-04 MED ORDER — ENSURE PRE-SURGERY PO LIQD
296.0000 mL | Freq: Once | ORAL | Status: DC
Start: 2020-09-05 — End: 2020-09-04

## 2020-09-04 MED ORDER — SCOPOLAMINE 1 MG/3DAYS TD PT72
MEDICATED_PATCH | TRANSDERMAL | Status: AC
  Filled 2020-09-04: qty 1

## 2020-09-04 MED ORDER — MIDAZOLAM HCL 5 MG/5ML IJ SOLN
INTRAMUSCULAR | Status: DC | PRN
  Administered 2020-09-04: 2 mg via INTRAVENOUS

## 2020-09-04 MED ORDER — OXYCODONE HCL 5 MG PO TABS
5.0000 mg | ORAL_TABLET | Freq: Four times a day (QID) | ORAL | 0 refills | Status: AC | PRN
  Filled 2020-09-04: qty 25, 7d supply, fill #0

## 2020-09-04 MED ORDER — ACETAMINOPHEN 500 MG PO TABS
ORAL_TABLET | ORAL | Status: AC
  Filled 2020-09-04: qty 2

## 2020-09-04 MED ORDER — ONDANSETRON HCL 4 MG/2ML IJ SOLN
INTRAMUSCULAR | Status: DC | PRN
  Administered 2020-09-04: 4 mg via INTRAVENOUS

## 2020-09-04 MED ORDER — SCOPOLAMINE 1 MG/3DAYS TD PT72
1.0000 | MEDICATED_PATCH | Freq: Once | TRANSDERMAL | Status: DC
  Administered 2020-09-04: 1.5 mg via TRANSDERMAL

## 2020-09-04 MED ORDER — DEXAMETHASONE SODIUM PHOSPHATE 10 MG/ML IJ SOLN
INTRAMUSCULAR | Status: DC | PRN
  Administered 2020-09-04: 5 mg via INTRAVENOUS

## 2020-09-04 MED ORDER — OXYCODONE HCL 5 MG PO TABS
ORAL_TABLET | ORAL | Status: AC
  Filled 2020-09-04: qty 1

## 2020-09-04 MED ORDER — PROPOFOL 10 MG/ML IV BOLUS
INTRAVENOUS | Status: DC | PRN
  Administered 2020-09-04: 180 mg via INTRAVENOUS

## 2020-09-04 SURGICAL SUPPLY — 38 items
ADH SKN CLS APL DERMABOND .7 (GAUZE/BANDAGES/DRESSINGS) ×2
APL PRP STRL LF DISP 70% ISPRP (MISCELLANEOUS) ×1
BLADE SURG 15 STRL LF DISP TIS (BLADE) ×2 IMPLANT
BLADE SURG 15 STRL SS (BLADE) ×4
CANISTER SUCT 1200ML W/VALVE (MISCELLANEOUS) ×2 IMPLANT
CHLORAPREP W/TINT 26 (MISCELLANEOUS) ×2 IMPLANT
COVER BACK TABLE 60X90IN (DRAPES) ×2 IMPLANT
COVER MAYO STAND STRL (DRAPES) ×2 IMPLANT
DERMABOND ADVANCED (GAUZE/BANDAGES/DRESSINGS) ×2
DERMABOND ADVANCED .7 DNX12 (GAUZE/BANDAGES/DRESSINGS) ×2 IMPLANT
DRAPE LAPAROTOMY 100X72 PEDS (DRAPES) ×2 IMPLANT
DRAPE UTILITY XL STRL (DRAPES) ×2 IMPLANT
ELECT REM PT RETURN 9FT ADLT (ELECTROSURGICAL) ×2
ELECTRODE REM PT RTRN 9FT ADLT (ELECTROSURGICAL) ×1 IMPLANT
GLOVE SURG POLYISO LF SZ6 (GLOVE) ×2 IMPLANT
GLOVE SURG POLYISO LF SZ6.5 (GLOVE) ×2 IMPLANT
GLOVE SURG POLYISO LF SZ7.5 (GLOVE) ×2 IMPLANT
GLOVE SURG UNDER POLY LF SZ7 (GLOVE) ×4 IMPLANT
GOWN STRL REUS W/ TWL LRG LVL3 (GOWN DISPOSABLE) ×1 IMPLANT
GOWN STRL REUS W/ TWL XL LVL3 (GOWN DISPOSABLE) ×1 IMPLANT
GOWN STRL REUS W/TWL LRG LVL3 (GOWN DISPOSABLE) ×2
GOWN STRL REUS W/TWL XL LVL3 (GOWN DISPOSABLE) ×2
NEEDLE HYPO 25X1 1.5 SAFETY (NEEDLE) ×2 IMPLANT
NS IRRIG 1000ML POUR BTL (IV SOLUTION) IMPLANT
PACK BASIN DAY SURGERY FS (CUSTOM PROCEDURE TRAY) ×2 IMPLANT
PENCIL SMOKE EVACUATOR (MISCELLANEOUS) ×2 IMPLANT
SLEEVE SCD COMPRESS KNEE MED (STOCKING) ×2 IMPLANT
SPONGE LAP 4X18 RFD (DISPOSABLE) ×2 IMPLANT
SUT MNCRL AB 4-0 PS2 18 (SUTURE) ×2 IMPLANT
SUT VIC AB 2-0 CT3 27 (SUTURE) ×2 IMPLANT
SUT VIC AB 2-0 SH 27 (SUTURE)
SUT VIC AB 2-0 SH 27XBRD (SUTURE) IMPLANT
SUT VIC AB 3-0 SH 27 (SUTURE) ×2
SUT VIC AB 3-0 SH 27X BRD (SUTURE) ×1 IMPLANT
SYR CONTROL 10ML LL (SYRINGE) ×2 IMPLANT
TOWEL GREEN STERILE FF (TOWEL DISPOSABLE) ×2 IMPLANT
TUBE CONNECTING 20X1/4 (TUBING) ×2 IMPLANT
YANKAUER SUCT BULB TIP NO VENT (SUCTIONS) ×2 IMPLANT

## 2020-09-04 NOTE — Discharge Instructions (Signed)
Ok to shower starting tomorrow  Ice pack, tylenol, and ibuprofen also for pain.  May have Tylenol after 3pm today.  No vigorous activity for one week  Post Anesthesia Home Care Instructions  Activity: Get plenty of rest for the remainder of the day. A responsible individual must stay with you for 24 hours following the procedure.  For the next 24 hours, DO NOT: -Drive a car -Paediatric nurse -Drink alcoholic beverages -Take any medication unless instructed by your physician -Make any legal decisions or sign important papers.  Meals: Start with liquid foods such as gelatin or soup. Progress to regular foods as tolerated. Avoid greasy, spicy, heavy foods. If nausea and/or vomiting occur, drink only clear liquids until the nausea and/or vomiting subsides. Call your physician if vomiting continues.  Special Instructions/Symptoms: Your throat may feel dry or sore from the anesthesia or the breathing tube placed in your throat during surgery. If this causes discomfort, gargle with warm salt water. The discomfort should disappear within 24 hours.  If you had a scopolamine patch placed behind your ear for the management of post- operative nausea and/or vomiting:  1. The medication in the patch is effective for 72 hours, after which it should be removed.  Wrap patch in a tissue and discard in the trash. Wash hands thoroughly with soap and water. 2. You may remove the patch earlier than 72 hours if you experience unpleasant side effects which may include dry mouth, dizziness or visual disturbances. 3. Avoid touching the patch. Wash your hands with soap and water after contact with the patch.    OXYCODONE 5 MG given at 12:50

## 2020-09-04 NOTE — Anesthesia Procedure Notes (Signed)
Procedure Name: LMA Insertion Date/Time: 09/04/2020 11:00 AM Performed by: Lavonia Dana, CRNA Pre-anesthesia Checklist: Patient identified, Emergency Drugs available, Suction available and Patient being monitored Patient Re-evaluated:Patient Re-evaluated prior to induction Oxygen Delivery Method: Circle system utilized Preoxygenation: Pre-oxygenation with 100% oxygen Induction Type: IV induction Ventilation: Mask ventilation without difficulty LMA: LMA inserted LMA Size: 4.0 Number of attempts: 1 Airway Equipment and Method: Bite block Placement Confirmation: positive ETCO2 Tube secured with: Tape Dental Injury: Teeth and Oropharynx as per pre-operative assessment

## 2020-09-04 NOTE — Transfer of Care (Signed)
Immediate Anesthesia Transfer of Care Note  Patient: Carrie Maynard  Procedure(s) Performed: EXCISION MASS ABDOMINAL (N/A Abdomen)  Patient Location: PACU  Anesthesia Type:General  Level of Consciousness: sedated  Airway & Oxygen Therapy: Patient Spontanous Breathing and Patient connected to face mask oxygen  Post-op Assessment: Report given to RN and Post -op Vital signs reviewed and stable  Post vital signs: Reviewed and stable  Last Vitals:  Vitals Value Taken Time  BP 112/59 09/04/20 1138  Temp    Pulse 89 09/04/20 1140  Resp 13 09/04/20 1140  SpO2 100 % 09/04/20 1140  Vitals shown include unvalidated device data.  Last Pain:  Vitals:   09/04/20 0852  TempSrc: Oral  PainSc: 6       Patients Stated Pain Goal: 6 (21/22/48 2500)  Complications: No complications documented.

## 2020-09-04 NOTE — Anesthesia Postprocedure Evaluation (Signed)
Anesthesia Post Note  Patient: Carrie Maynard  Procedure(s) Performed: EXCISION MASS ABDOMINAL (N/A Abdomen)     Patient location during evaluation: PACU Anesthesia Type: General Level of consciousness: awake and alert and oriented Pain management: pain level controlled Vital Signs Assessment: post-procedure vital signs reviewed and stable Respiratory status: spontaneous breathing, nonlabored ventilation and respiratory function stable Cardiovascular status: blood pressure returned to baseline Postop Assessment: no apparent nausea or vomiting Anesthetic complications: no   No complications documented.  Last Vitals:  Vitals:   09/04/20 1250 09/04/20 1254  BP: (!) 150/96   Pulse:  80  Resp: 16   Temp: 36.6 C   SpO2:  99%    Last Pain:  Vitals:   09/04/20 1254  TempSrc:   PainSc: [place]

## 2020-09-04 NOTE — Interval H&P Note (Signed)
History and Physical Interval Note: no change in H and P  09/04/2020 10:36 AM  Carrie Maynard  has presented today for surgery, with the diagnosis of UMBILICAL MASS.  The various methods of treatment have been discussed with the patient and family. After consideration of risks, benefits and other options for treatment, the patient has consented to  Procedure(s): EXCISION MASS ABDOMINAL (N/A) as a surgical intervention.  The patient's history has been reviewed, patient examined, no change in status, stable for surgery.  I have reviewed the patient's chart and labs.  Questions were answered to the patient's satisfaction.     Coralie Keens

## 2020-09-04 NOTE — Op Note (Signed)
EXCISION MASS ABDOMINAL  Procedure Note  Carrie Maynard 09/04/2020   Pre-op Diagnosis: UMBILICAL MASS     Post-op Diagnosis: same  Procedure(s): EXCISION  2 cm umbilical mass  Surgeon(s): Coralie Keens, MD Carlena Hurl, PA-C  Anesthesia: General  Staff:  Circulator: Tamala Julian, RN; Izora Ribas, RN Scrub Person: Lorenza Burton, CST  Estimated Blood Loss: Minimal               Specimens: sent to path  Indications: This is a 40 year old female who presents with a chronically draining small mass at her umbilicus.  It measured almost 2 cm on CT scan.  The decision was made to proceed with excision of the mass.  Procedure: The patient was brought to the operating room identifies correct patient.  She is placed upon the operating table general anesthesia was induced.  Her abdomen was prepped and draped in usual sterile fashion.  I anesthetized skin around the visible mass in the depths of her umbilicus with Marcaine.  I then performed an elliptical incision around the mass using a previous scar with a 15 blade scalpel.  I then excised the mass with a scalpel.  Underneath the mass was more firm tissue in the depth of the subcutaneous tissue of the umbilicus.  It was difficult to tell whether this was a foreign body like mesh or chronic reactive tissue.  I excised it piecemeal with a scalpel and sent for further evaluation by pathology as well.  At this point, it appeared that most of the mass has been removed.  I could palpate no other abnormalities.  Hemostasis peer to be achieved.  We anesthetized the incision further with Marcaine.  I then closed the deep subtenons tissue with a single 2-0 Vicryl suture.  I then closed the skin with interrupted 4-0 Monocryl sutures.  Dermabond was then applied.  The patient tolerated procedure well.  All the counts were correct at the end of the procedure.  The patient was then extubated in the operating room and taken in stable condition to the  recovery room.          Coralie Keens   Date: 09/04/2020  Time: 11:35 AM

## 2020-09-05 ENCOUNTER — Encounter (HOSPITAL_BASED_OUTPATIENT_CLINIC_OR_DEPARTMENT_OTHER): Payer: Self-pay | Admitting: Surgery

## 2020-09-05 ENCOUNTER — Telehealth: Payer: Self-pay

## 2020-09-05 LAB — SURGICAL PATHOLOGY

## 2020-09-05 NOTE — Telephone Encounter (Signed)
Pt called to r.s her appts as she did not want to come during covid times   Carrie Maynard

## 2020-09-05 NOTE — Addendum Note (Signed)
Addendum  created 09/05/20 1236 by Estefani Bateson, Ernesta Amble, CRNA   Charge Capture section accepted

## 2020-09-26 ENCOUNTER — Other Ambulatory Visit (HOSPITAL_COMMUNITY): Payer: Self-pay

## 2020-09-26 MED ORDER — AMOXICILLIN 500 MG PO CAPS
ORAL_CAPSULE | ORAL | 0 refills | Status: DC
  Filled 2020-09-26: qty 25, 6d supply, fill #0

## 2020-09-26 MED ORDER — FLUCONAZOLE 150 MG PO TABS
150.0000 mg | ORAL_TABLET | ORAL | 0 refills | Status: DC
  Filled 2020-09-26: qty 4, 4d supply, fill #0

## 2020-10-01 ENCOUNTER — Other Ambulatory Visit (HOSPITAL_COMMUNITY): Payer: Self-pay

## 2020-10-01 MED ORDER — ACETAMINOPHEN-CODEINE #3 300-30 MG PO TABS
ORAL_TABLET | ORAL | 0 refills | Status: DC
  Filled 2020-10-01: qty 12, 3d supply, fill #0

## 2020-10-01 MED ORDER — CHLORHEXIDINE GLUCONATE 0.12 % MT SOLN
OROMUCOSAL | 0 refills | Status: DC
  Filled 2020-10-01: qty 473, 14d supply, fill #0

## 2020-10-01 MED ORDER — AMOXICILLIN 500 MG PO CAPS
500.0000 mg | ORAL_CAPSULE | Freq: Three times a day (TID) | ORAL | 0 refills | Status: DC
  Filled 2020-10-01: qty 9, 3d supply, fill #0

## 2020-10-07 DIAGNOSIS — R87610 Atypical squamous cells of undetermined significance on cytologic smear of cervix (ASC-US): Secondary | ICD-10-CM | POA: Diagnosis not present

## 2020-10-07 DIAGNOSIS — Z01419 Encounter for gynecological examination (general) (routine) without abnormal findings: Secondary | ICD-10-CM | POA: Diagnosis not present

## 2020-10-07 DIAGNOSIS — N920 Excessive and frequent menstruation with regular cycle: Secondary | ICD-10-CM | POA: Diagnosis not present

## 2020-10-07 DIAGNOSIS — N946 Dysmenorrhea, unspecified: Secondary | ICD-10-CM | POA: Diagnosis not present

## 2020-10-07 DIAGNOSIS — N809 Endometriosis, unspecified: Secondary | ICD-10-CM | POA: Diagnosis not present

## 2020-10-11 ENCOUNTER — Other Ambulatory Visit: Payer: Self-pay | Admitting: Family

## 2020-10-11 DIAGNOSIS — D573 Sickle-cell trait: Secondary | ICD-10-CM

## 2020-10-11 DIAGNOSIS — D500 Iron deficiency anemia secondary to blood loss (chronic): Secondary | ICD-10-CM

## 2020-10-14 ENCOUNTER — Other Ambulatory Visit: Payer: Self-pay

## 2020-10-14 ENCOUNTER — Telehealth: Payer: Self-pay

## 2020-10-14 ENCOUNTER — Inpatient Hospital Stay: Payer: 59 | Attending: Hematology & Oncology

## 2020-10-14 ENCOUNTER — Encounter: Payer: Self-pay | Admitting: Family

## 2020-10-14 ENCOUNTER — Inpatient Hospital Stay (HOSPITAL_BASED_OUTPATIENT_CLINIC_OR_DEPARTMENT_OTHER): Payer: 59 | Admitting: Family

## 2020-10-14 VITALS — BP 144/91 | HR 88 | Temp 98.7°F | Resp 18 | Ht 66.5 in | Wt 197.0 lb

## 2020-10-14 DIAGNOSIS — D573 Sickle-cell trait: Secondary | ICD-10-CM | POA: Insufficient documentation

## 2020-10-14 DIAGNOSIS — D500 Iron deficiency anemia secondary to blood loss (chronic): Secondary | ICD-10-CM | POA: Insufficient documentation

## 2020-10-14 DIAGNOSIS — N920 Excessive and frequent menstruation with regular cycle: Secondary | ICD-10-CM | POA: Diagnosis not present

## 2020-10-14 LAB — CBC WITH DIFFERENTIAL (CANCER CENTER ONLY)
Abs Immature Granulocytes: 0.01 10*3/uL (ref 0.00–0.07)
Basophils Absolute: 0.1 10*3/uL (ref 0.0–0.1)
Basophils Relative: 1 %
Eosinophils Absolute: 0.1 10*3/uL (ref 0.0–0.5)
Eosinophils Relative: 2 %
HCT: 35.7 % — ABNORMAL LOW (ref 36.0–46.0)
Hemoglobin: 12.1 g/dL (ref 12.0–15.0)
Immature Granulocytes: 0 %
Lymphocytes Relative: 35 %
Lymphs Abs: 2.1 10*3/uL (ref 0.7–4.0)
MCH: 27.1 pg (ref 26.0–34.0)
MCHC: 33.9 g/dL (ref 30.0–36.0)
MCV: 79.9 fL — ABNORMAL LOW (ref 80.0–100.0)
Monocytes Absolute: 0.5 10*3/uL (ref 0.1–1.0)
Monocytes Relative: 8 %
Neutro Abs: 3.2 10*3/uL (ref 1.7–7.7)
Neutrophils Relative %: 54 %
Platelet Count: 247 10*3/uL (ref 150–400)
RBC: 4.47 MIL/uL (ref 3.87–5.11)
RDW: 13.5 % (ref 11.5–15.5)
WBC Count: 5.9 10*3/uL (ref 4.0–10.5)
nRBC: 0 % (ref 0.0–0.2)

## 2020-10-14 LAB — RETICULOCYTES
Immature Retic Fract: 19.1 % — ABNORMAL HIGH (ref 2.3–15.9)
RBC.: 4.47 MIL/uL (ref 3.87–5.11)
Retic Count, Absolute: 81.4 10*3/uL (ref 19.0–186.0)
Retic Ct Pct: 1.8 % (ref 0.4–3.1)

## 2020-10-14 NOTE — Progress Notes (Signed)
Hematology and Oncology Follow Up Visit  KENZINGTON MIELKE [id number] February 23, 1981 40 y.o. 10/14/2020   Principle Diagnosis:  Iron deficiency anemia Sickle cell trait   Current Therapy:        Folic acid 1 mg PO daily PO iron supplement daily   Interim History:  Ms. Devivo is here today for follow-up. She is doing fairly well. She had surgery on 09/04/2020 to remove a uterine mass that turned out to be endometriosis. She has a follow-up US with her gynecologist and then will discuss birth control vs hysterectomy.  She states that her cycle is better but flow is still heavy. The abdominal pain has resolved.  No other blood loss noted. No bruising or petechiae.  She did have an abscessed tooth extracted and just finished her antibiotic. No fever, chills, n/v, cough, rash, dizziness, SOB, chest pain, palpitations, abdominal pain or changes in bowel or bladder habits.  No swelling, tenderness, numbness or tingling in her extremities.  No falls or syncope.  She has maintained a good appetite and is staying well hydrated. Her weight is stable at 197 lbs.   ECOG Performance Status: 1 - Symptomatic but completely ambulatory  Medications:  Allergies as of 10/14/2020       Reactions   Latex Rash        Medication List        Accurate as of October 14, 2020 11:30 AM. If you have any questions, ask your nurse or doctor.          acetaminophen-codeine 300-30 MG tablet Commonly known as: TYLENOL #3 Take 1 tablet by mouth every 6 hours as needed for pain   amoxicillin 500 MG capsule Commonly known as: AMOXIL Take 1 capsule (500 mg total) by mouth 3 (three) times daily until complete   APPLE CIDER VINEGAR PO Take 2 tablets by mouth daily.   bisoprolol 5 MG tablet Commonly known as: ZEBETA TAKE 1 TABLET BY MOUTH DAILY   bisoprolol 5 MG tablet Commonly known as: ZEBETA TAKE 1 TABLET BY MOUTH ONCE A DAY   bisoprolol 5 MG tablet Commonly known as: ZEBETA TAKE 1 TABLET BY MOUTH  ONCE A DAY   chlorhexidine 0.12 % solution Commonly known as: PERIDEX Swish 1/2 ounce in mouth for 2 full minutes then spit out --use twice daily   D3 VITAMIN PO Take 2,500 Units by mouth daily.   ferrous gluconate 324 MG tablet Commonly known as: FERGON Take 1 tablet (324 mg total) by mouth daily with breakfast.   fluconazole 150 MG tablet Commonly known as: DIFLUCAN Take 1 tablet (150 mg total) by mouth at first sign of a yeast infection   folic acid 1 MG tablet Commonly known as: FOLVITE Take 1 tablet (1 mg total) by mouth daily.   MULTIVITAMIN ADULT PO Take by mouth daily.   oxyCODONE 5 MG immediate release tablet Commonly known as: Oxy IR/ROXICODONE Take 1 tablet by mouth every 6 hours as needed for moderate, severe or breakthrough pain.   vitamin C with rose hips 500 MG tablet Take 500 mg by mouth daily.        Allergies:  Allergies  Allergen Reactions   Latex Rash    Past Medical History, Surgical history, Social history, and Family History were reviewed and updated.  Review of Systems: All other 10 point review of systems is negative.   Physical Exam:  vitals were not taken for this visit.   Wt Readings from Last 3 Encounters:  09/04/20 197  lb 12 oz (89.7 kg)  04/25/18 170 lb (77.1 kg)  02/23/18 170 lb 6.4 oz (77.3 kg)    Ocular: Sclerae unicteric, pupils equal, round and reactive to light Ear-nose-throat: Oropharynx clear, dentition fair Lymphatic: No cervical or supraclavicular adenopathy Lungs no rales or rhonchi, good excursion bilaterally Heart regular rate and rhythm, no murmur appreciated Abd soft, nontender, positive bowel sounds MSK no focal spinal tenderness, no joint edema Neuro: non-focal, well-oriented, appropriate affect Breasts: Deferred   Lab Results  Component Value Date   WBC 5.9 10/14/2020   HGB 12.1 10/14/2020   HCT 35.7 (L) 10/14/2020   MCV 79.9 (L) 10/14/2020   PLT 247 10/14/2020   Lab Results  Component Value  Date   FERRITIN 13 11/24/2018   IRON 129 11/24/2018   TIBC 344 11/24/2018   UIBC 215 11/24/2018   IRONPCTSAT 37 11/24/2018   Lab Results  Component Value Date   RETICCTPCT 1.8 10/14/2020   RBC 4.47 10/14/2020   No results found for: KPAFRELGTCHN, LAMBDASER, KAPLAMBRATIO No results found for: IGGSERUM, IGA, IGMSERUM No results found for: Odetta Pink, SPEI   Chemistry      Component Value Date/Time   NA 139 06/28/2017 0850   NA 142 03/31/2017 0834   K 3.9 06/28/2017 0850   K 3.5 03/31/2017 0834   CL 107 06/28/2017 0850   CL 106 03/31/2017 0834   CO2 25 06/28/2017 0850   CO2 28 03/31/2017 0834   BUN 12 06/28/2017 0850   BUN 9 03/31/2017 0834   CREATININE 1.00 07/26/2020 1810   CREATININE 0.94 06/28/2017 0850   CREATININE 0.9 03/31/2017 0834      Component Value Date/Time   CALCIUM 9.5 06/28/2017 0850   CALCIUM 9.0 03/31/2017 0834   ALKPHOS 44 06/28/2017 0850   ALKPHOS 50 03/31/2017 0834   AST 15 06/28/2017 0850   ALT 14 06/28/2017 0850   ALT 20 03/31/2017 0834   BILITOT 0.4 06/28/2017 0850       Impression and Plan: Ms. Gatling is a very pleasant 40 yo African American female with both iron deficiency anemia secondary to heavy cycles and the sickle cell trait. She is doing well on folic acid and will continue her same regimen.  Iron studies are pending. We will replace if needed.  Follow-up in 6 months.  She can contact our office with any questions or concerns.   Laverna Peace, NP 6/13/202211:30 AM

## 2020-10-14 NOTE — Telephone Encounter (Signed)
Appts made per 10/14/20 los   [employer]

## 2020-10-15 ENCOUNTER — Other Ambulatory Visit: Payer: Self-pay | Admitting: Family

## 2020-10-15 LAB — IRON AND TIBC
Iron: 40 ug/dL — ABNORMAL LOW (ref 41–142)
Saturation Ratios: 11 % — ABNORMAL LOW (ref 21–57)
TIBC: 351 ug/dL (ref 236–444)
UIBC: 311 ug/dL (ref 120–384)

## 2020-10-15 LAB — FERRITIN: Ferritin: 14 ng/mL (ref 11–307)

## 2020-10-21 ENCOUNTER — Ambulatory Visit: Payer: 59

## 2020-10-21 ENCOUNTER — Other Ambulatory Visit (HOSPITAL_COMMUNITY): Payer: Self-pay

## 2020-10-21 DIAGNOSIS — N920 Excessive and frequent menstruation with regular cycle: Secondary | ICD-10-CM | POA: Diagnosis not present

## 2020-10-21 DIAGNOSIS — N946 Dysmenorrhea, unspecified: Secondary | ICD-10-CM | POA: Diagnosis not present

## 2020-10-21 DIAGNOSIS — N809 Endometriosis, unspecified: Secondary | ICD-10-CM | POA: Diagnosis not present

## 2020-10-21 MED ORDER — NORETHINDRONE ACETATE 5 MG PO TABS
5.0000 mg | ORAL_TABLET | Freq: Every day | ORAL | 2 refills | Status: DC
  Filled 2020-10-21: qty 30, 30d supply, fill #0
  Filled 2020-11-29: qty 30, 30d supply, fill #1

## 2020-10-22 ENCOUNTER — Other Ambulatory Visit (HOSPITAL_COMMUNITY): Payer: Self-pay

## 2020-10-28 ENCOUNTER — Ambulatory Visit: Payer: 59

## 2020-10-31 DIAGNOSIS — Z0184 Encounter for antibody response examination: Secondary | ICD-10-CM | POA: Diagnosis not present

## 2020-11-20 DIAGNOSIS — I8312 Varicose veins of left lower extremity with inflammation: Secondary | ICD-10-CM | POA: Diagnosis not present

## 2020-11-29 ENCOUNTER — Other Ambulatory Visit (HOSPITAL_COMMUNITY): Payer: Self-pay

## 2020-12-26 ENCOUNTER — Other Ambulatory Visit (HOSPITAL_COMMUNITY): Payer: Self-pay

## 2020-12-26 DIAGNOSIS — N809 Endometriosis, unspecified: Secondary | ICD-10-CM | POA: Diagnosis not present

## 2020-12-26 DIAGNOSIS — N920 Excessive and frequent menstruation with regular cycle: Secondary | ICD-10-CM | POA: Diagnosis not present

## 2020-12-26 MED ORDER — TRANEXAMIC ACID 650 MG PO TABS
ORAL_TABLET | ORAL | 11 refills | Status: DC
  Filled 2020-12-26: qty 30, 5d supply, fill #0
  Filled 2021-02-19: qty 30, 5d supply, fill #1
  Filled 2021-05-22: qty 30, 5d supply, fill #2
  Filled 2021-09-26: qty 30, 5d supply, fill #3
  Filled 2021-10-19: qty 30, 5d supply, fill #4
  Filled 2021-11-11: qty 30, 5d supply, fill #5

## 2020-12-26 MED ORDER — NAPROXEN 500 MG PO TABS
ORAL_TABLET | ORAL | 3 refills | Status: DC
  Filled 2020-12-26: qty 30, 90d supply, fill #0
  Filled 2021-05-22: qty 30, 90d supply, fill #1
  Filled 2021-09-26: qty 30, 90d supply, fill #2

## 2021-01-13 DIAGNOSIS — N939 Abnormal uterine and vaginal bleeding, unspecified: Secondary | ICD-10-CM | POA: Diagnosis not present

## 2021-01-13 DIAGNOSIS — N809 Endometriosis, unspecified: Secondary | ICD-10-CM | POA: Diagnosis not present

## 2021-01-13 DIAGNOSIS — N946 Dysmenorrhea, unspecified: Secondary | ICD-10-CM | POA: Diagnosis not present

## 2021-01-21 ENCOUNTER — Other Ambulatory Visit (HOSPITAL_COMMUNITY): Payer: Self-pay

## 2021-01-21 MED ORDER — AMOXICILLIN 500 MG PO CAPS
ORAL_CAPSULE | ORAL | 0 refills | Status: DC
  Filled 2021-01-21: qty 21, 7d supply, fill #0

## 2021-01-24 ENCOUNTER — Other Ambulatory Visit (HOSPITAL_COMMUNITY): Payer: Self-pay

## 2021-01-24 ENCOUNTER — Encounter: Payer: Self-pay | Admitting: Family

## 2021-01-27 ENCOUNTER — Other Ambulatory Visit (HOSPITAL_COMMUNITY): Payer: Self-pay

## 2021-01-27 MED ORDER — ACETAMINOPHEN-CODEINE #3 300-30 MG PO TABS
ORAL_TABLET | ORAL | 0 refills | Status: AC
  Filled 2021-01-27: qty 15, 5d supply, fill #0

## 2021-01-27 MED ORDER — CHLORHEXIDINE GLUCONATE 0.12 % MT SOLN
OROMUCOSAL | 0 refills | Status: DC
  Filled 2021-01-27: qty 473, 16d supply, fill #0

## 2021-01-29 DIAGNOSIS — I10 Essential (primary) hypertension: Secondary | ICD-10-CM | POA: Diagnosis not present

## 2021-01-29 DIAGNOSIS — R7309 Other abnormal glucose: Secondary | ICD-10-CM | POA: Diagnosis not present

## 2021-01-29 DIAGNOSIS — Z13220 Encounter for screening for lipoid disorders: Secondary | ICD-10-CM | POA: Diagnosis not present

## 2021-01-29 DIAGNOSIS — Z79899 Other long term (current) drug therapy: Secondary | ICD-10-CM | POA: Diagnosis not present

## 2021-01-29 DIAGNOSIS — R946 Abnormal results of thyroid function studies: Secondary | ICD-10-CM | POA: Diagnosis not present

## 2021-02-05 DIAGNOSIS — R946 Abnormal results of thyroid function studies: Secondary | ICD-10-CM | POA: Diagnosis not present

## 2021-02-05 DIAGNOSIS — R7309 Other abnormal glucose: Secondary | ICD-10-CM | POA: Diagnosis not present

## 2021-02-05 DIAGNOSIS — N809 Endometriosis, unspecified: Secondary | ICD-10-CM | POA: Diagnosis not present

## 2021-02-05 DIAGNOSIS — D573 Sickle-cell trait: Secondary | ICD-10-CM | POA: Diagnosis not present

## 2021-02-05 DIAGNOSIS — Z0000 Encounter for general adult medical examination without abnormal findings: Secondary | ICD-10-CM | POA: Diagnosis not present

## 2021-02-05 DIAGNOSIS — I10 Essential (primary) hypertension: Secondary | ICD-10-CM | POA: Diagnosis not present

## 2021-02-06 DIAGNOSIS — H52223 Regular astigmatism, bilateral: Secondary | ICD-10-CM | POA: Diagnosis not present

## 2021-02-06 DIAGNOSIS — H5213 Myopia, bilateral: Secondary | ICD-10-CM | POA: Diagnosis not present

## 2021-02-10 ENCOUNTER — Other Ambulatory Visit: Payer: Self-pay

## 2021-02-10 ENCOUNTER — Encounter: Payer: 59 | Attending: Family Medicine | Admitting: Registered"

## 2021-02-10 DIAGNOSIS — R109 Unspecified abdominal pain: Secondary | ICD-10-CM | POA: Diagnosis not present

## 2021-02-10 NOTE — Patient Instructions (Addendum)
Consider setting an alarm to make sure you catch the 9:30 melatonin surge. Continue to monitor your energy level and adjust intensity of exercise as needed. Continue to follow up with recovery meal, drink or snack.  Consider omega-3 Consider adding fish to your diet, can try to have for lunch. Walnuts, chia seeds, ground flaxseeds  Consider including more fiber. Consider have more beans and greens before your cycle.   Caffeine: Aim to have 400 milligrams or less.  Read labels look for fiber, whole grains,

## 2021-02-10 NOTE — Progress Notes (Addendum)
Medical Nutrition Therapy  Appointment Start time:  3220  Appointment End time:  1500  Primary concerns today: endometriosis  Referral diagnosis: [employer] Employee - self referred Preferred learning style: no preference indicated Learning readiness: ready   NUTRITION ASSESSMENT   Anthropometrics  Wt Readings from Last 3 Encounters:  10/14/20 197 lb (89.4 kg)  09/04/20 197 lb 12 oz (89.7 kg)  04/25/18 170 lb (77.1 kg)      Clinical Medical Hx: HTN (130/70) since COVID Medications: Zebeta  Labs: iron 40 ug/dL 10/14/20 Notable Signs/Symptoms: pain during cycle severe abdominal cramping, leg pain, back pain, current 10/10  Lifestyle & Dietary Hx Based on patient described symptoms after consuming milk, probably lactose intolerant, no ice cream.   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 On days off sleep routine changes goes to bed 10-11 pm, wakes up 4:30-5, sleep disturbed due to husband snoring. Pt states sleep is broken up: 2-3 hours sleep before work, 5 hours after work.  Energy: 7.5/10 when iron levels are good  Estimated daily fluid intake: 50 oz Supplements: Apple cider vinegar gummy, iron, folic acid, multivitamin, Vit D, melatonin sometimes Sleep: "could be better" 4-5 hr.  Stress / self-care: not assessed Current average weekly physical activity: 3-4 x/week 1 hour aerobic exercise. Was lifting weights but was too painful with endometriosis symptoms.  24-Hr Dietary Recall First Meal: raisin bread, (natures own honey wheat) black tea with milk (goes to gym) Brunch: eggs, [place] bacon, yogurt OR plant based protein OR oatmeal Third Meal: brown rice, street corn, black beans, ground [place] or wheat pasta alfredo with tri-color peppers, spinach Snack: none or a little muffin or cookie Beverages: water 16-18 oz, apple juice,  black tea with milk, coffee occasionally if tired 2-3 c.  Estimated  Energy Needs Calories: 2100  NUTRITION DIAGNOSIS  NB-1.1 Food and nutrition-related knowledge deficit As related to sources for fiber.  As evidenced by some.   NUTRITION INTERVENTION  Nutrition education (E-1) on the following topics:  Anti-inflammatory diet, including whole foods with fiber  Handouts Provided Include  Sleep hygiene MyPlate  Learning Style & Readiness for Change Teaching method utilized: Visual & Auditory  Demonstrated degree of understanding via: Teach Back  Barriers to learning/adherence to lifestyle change: none  Goals Established by Pt Increase fruit, vegetable and fiber intake Avoid processed foods   MONITORING & EVALUATION Dietary intake, weekly physical activity, and pain level in 4-6 weeks.  Next Steps  Patient is to work on goals, keep log of symptoms.

## 2021-02-12 DIAGNOSIS — L821 Other seborrheic keratosis: Secondary | ICD-10-CM | POA: Diagnosis not present

## 2021-02-12 IMAGING — MG MM  DIGITAL SCREENING BREAST BILAT IMPLANT W/ TOMO W/ CAD
8 of 12 series · 8 of 28 positions shown · non-contrast
Comparison: Previous exam(s).

CLINICAL DATA: Screening.

EXAM:
DIGITAL SCREENING BILATERAL MAMMOGRAM WITH IMPLANTS AND TOMO
The patient has retropectoral implants. Standard and implant
displaced views were performed.

[R MLO]
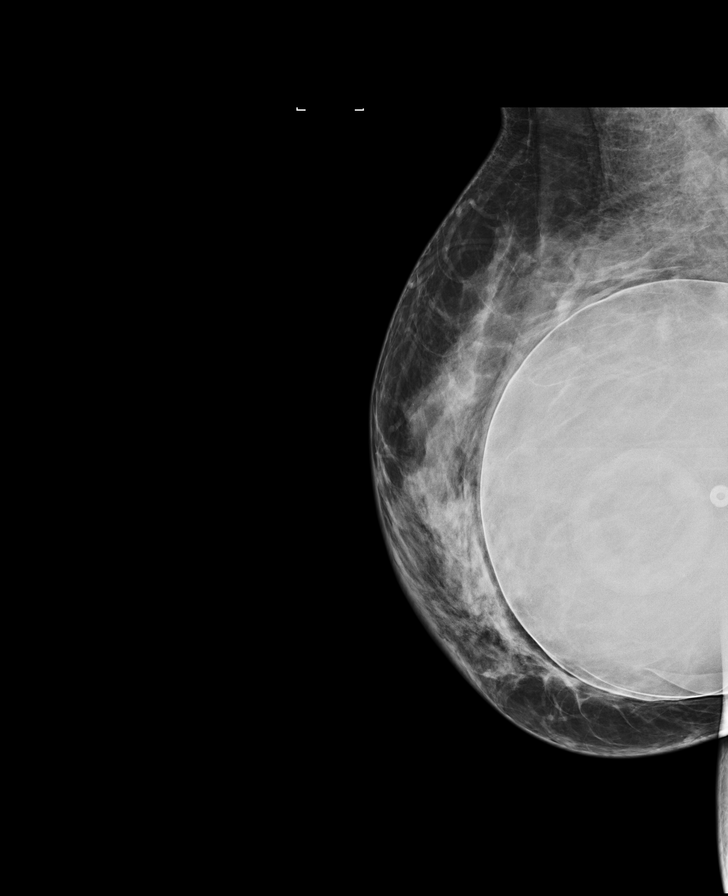

[L MLO]
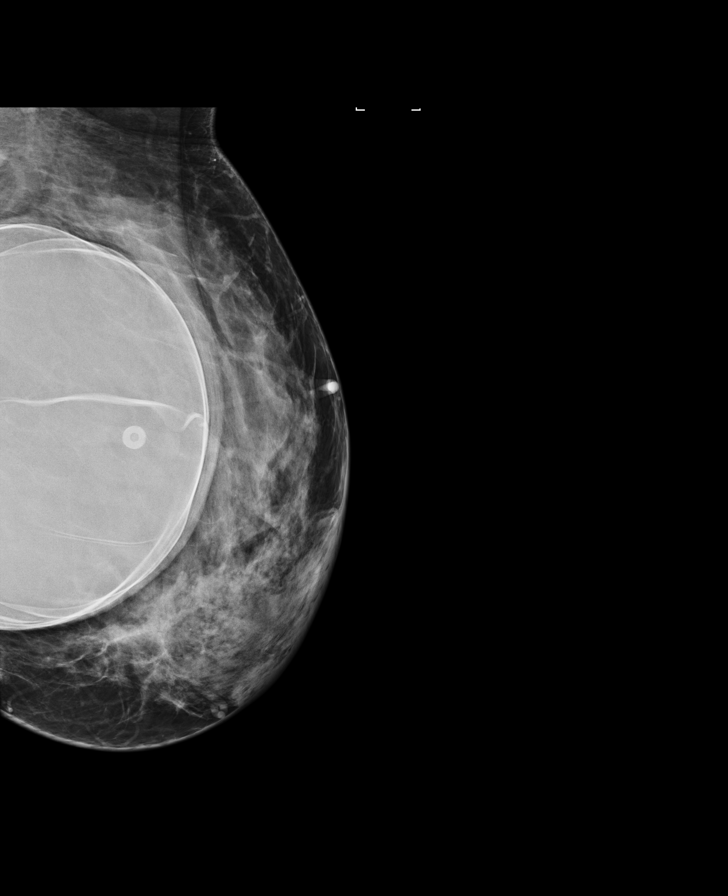

[L CC]
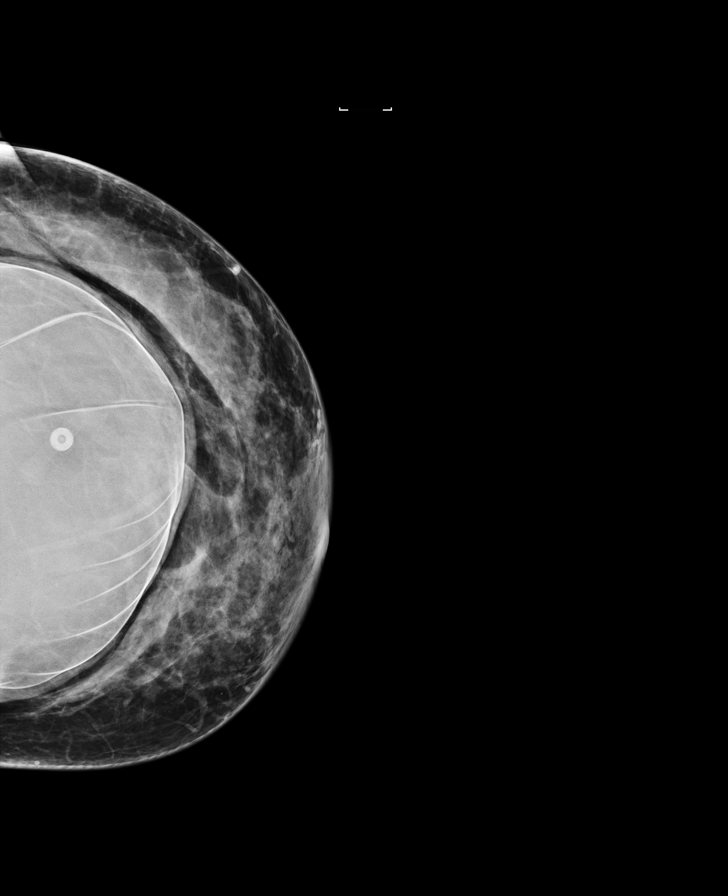

[R CC]
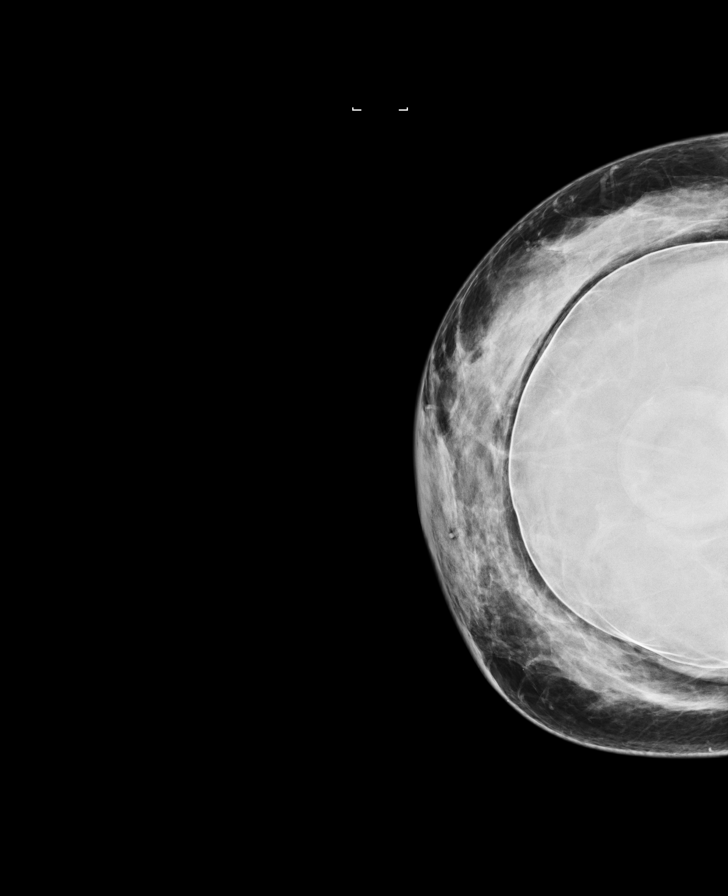

[R CC synth-2D]
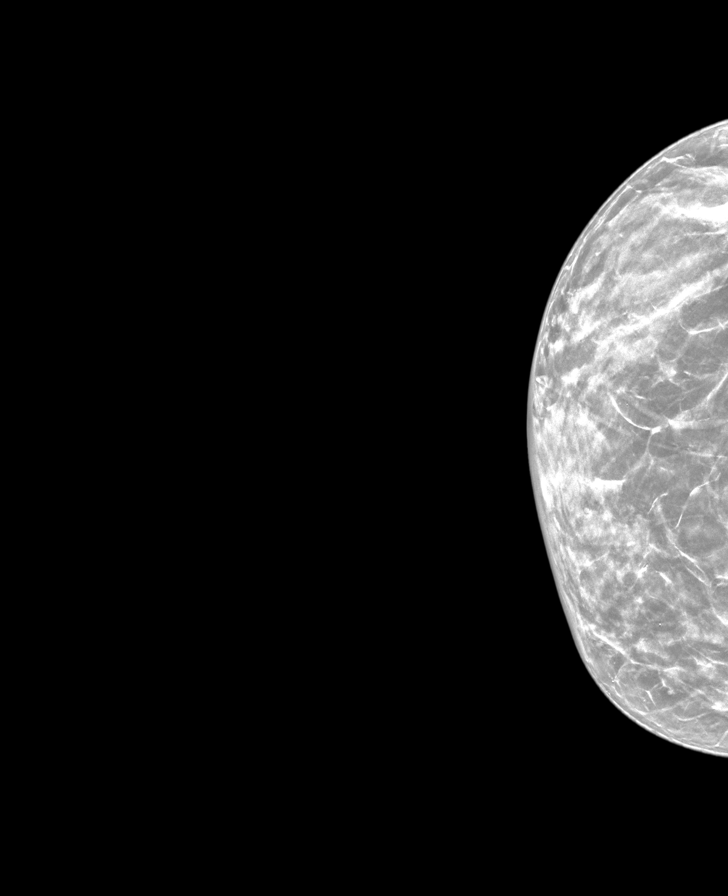

[L MLO synth-2D]
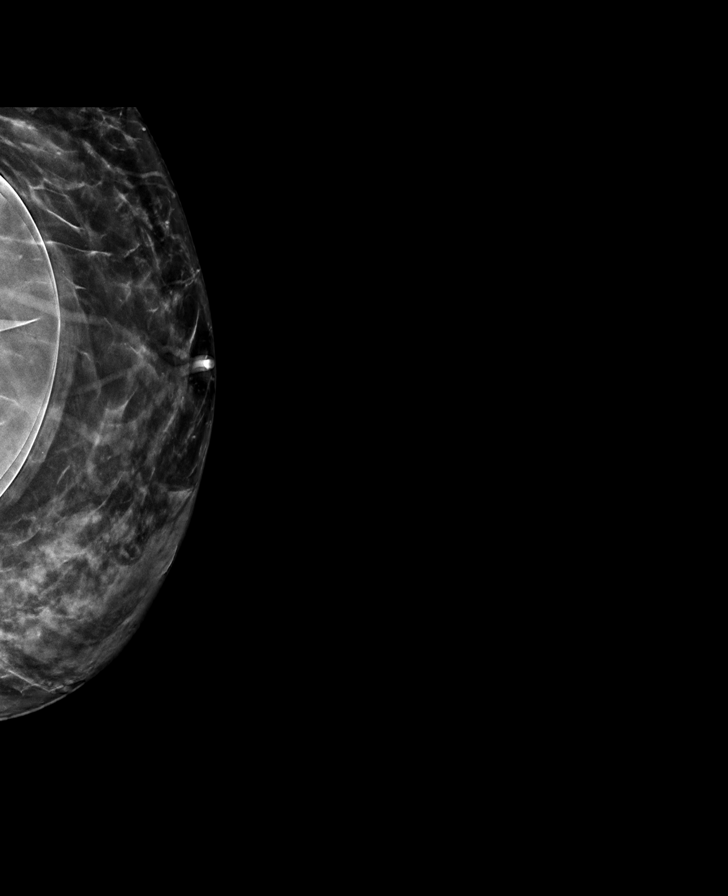

[L CC synth-2D]
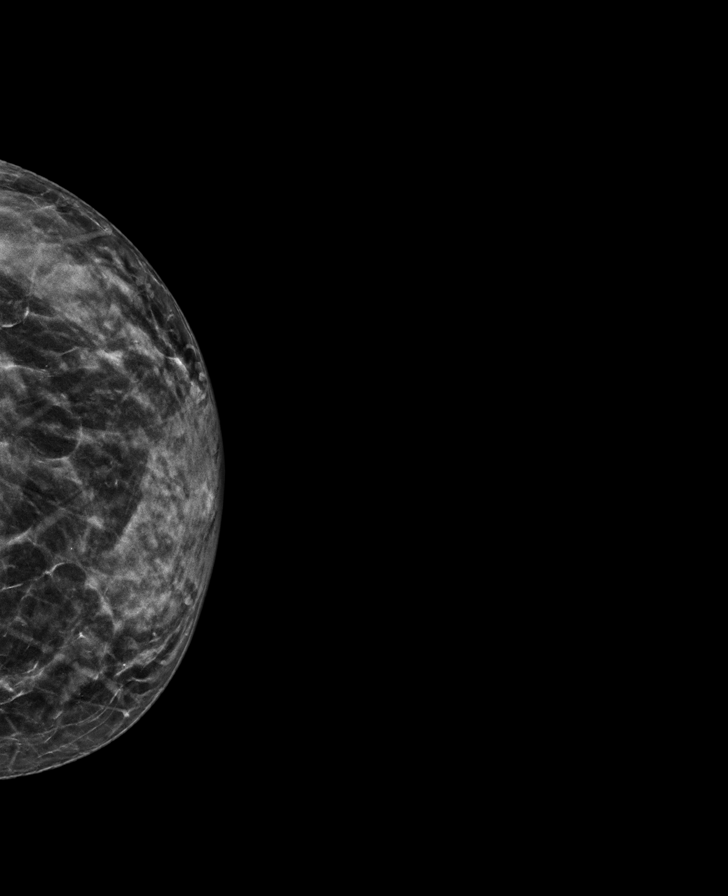

[R MLO synth-2D]
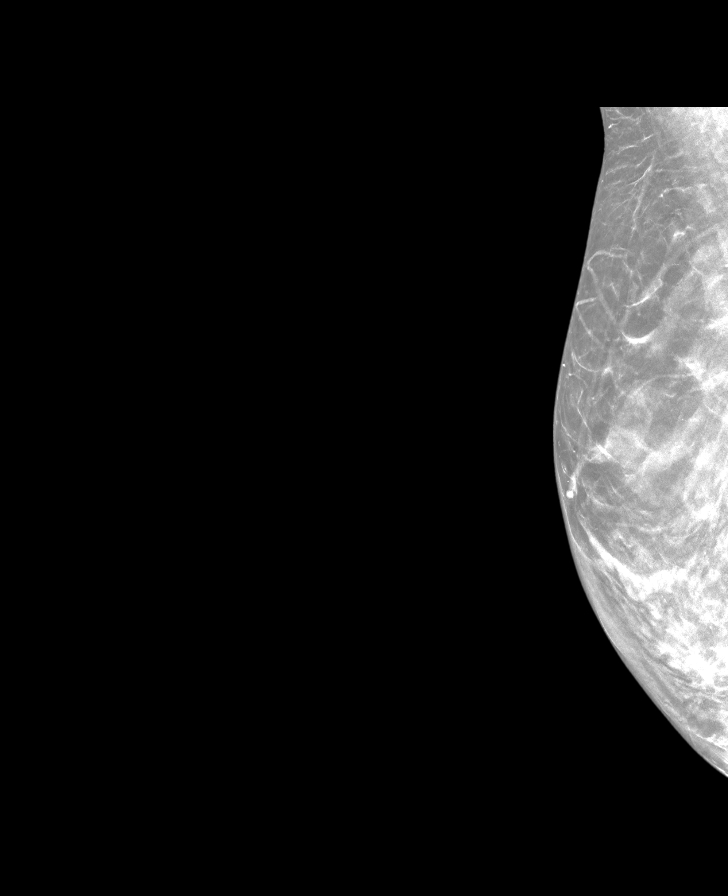

[8 of 28 positions shown; findings below may reference images not displayed]

ACR Breast Density Category c: The breast tissue is heterogeneously
dense, which may obscure small masses.
FINDINGS: There are no findings suspicious for malignancy. Bilateral
retropectoral saline implants are noted.
IMPRESSION: No mammographic evidence of malignancy. A result letter of this
screening mammogram will be mailed directly to the patient.

RECOMMENDATION:
Screening mammogram in one year. (Code:KS-8-GJG)

BI-RADS CATEGORY  1:  Negative.

## 2021-02-19 ENCOUNTER — Other Ambulatory Visit (HOSPITAL_COMMUNITY): Payer: Self-pay

## 2021-02-28 ENCOUNTER — Other Ambulatory Visit: Payer: Self-pay | Admitting: Family Medicine

## 2021-02-28 DIAGNOSIS — Z1231 Encounter for screening mammogram for malignant neoplasm of breast: Secondary | ICD-10-CM

## 2021-04-07 ENCOUNTER — Other Ambulatory Visit: Payer: 59

## 2021-04-07 ENCOUNTER — Ambulatory Visit: Payer: 59 | Admitting: Family

## 2021-04-15 ENCOUNTER — Ambulatory Visit
Admission: RE | Admit: 2021-04-15 | Discharge: 2021-04-15 | Disposition: A | Discharge status: Home / Self Care | Payer: 59 | Source: Ambulatory Visit | Attending: Family Medicine | Admitting: Family Medicine

## 2021-04-15 ENCOUNTER — Other Ambulatory Visit: Payer: Self-pay

## 2021-04-15 DIAGNOSIS — Z1231 Encounter for screening mammogram for malignant neoplasm of breast: Secondary | ICD-10-CM | POA: Diagnosis not present

## 2021-05-22 ENCOUNTER — Other Ambulatory Visit (HOSPITAL_COMMUNITY): Payer: Self-pay

## 2021-05-22 ENCOUNTER — Encounter: Payer: Self-pay | Admitting: Family

## 2021-05-23 ENCOUNTER — Other Ambulatory Visit (HOSPITAL_COMMUNITY): Payer: Self-pay

## 2021-07-24 ENCOUNTER — Encounter: Payer: Self-pay | Admitting: Family

## 2021-09-04 DIAGNOSIS — R030 Elevated blood-pressure reading, without diagnosis of hypertension: Secondary | ICD-10-CM | POA: Diagnosis not present

## 2021-09-04 DIAGNOSIS — N809 Endometriosis, unspecified: Secondary | ICD-10-CM | POA: Diagnosis not present

## 2021-09-04 DIAGNOSIS — N920 Excessive and frequent menstruation with regular cycle: Secondary | ICD-10-CM | POA: Diagnosis not present

## 2021-09-04 DIAGNOSIS — N939 Abnormal uterine and vaginal bleeding, unspecified: Secondary | ICD-10-CM | POA: Diagnosis not present

## 2021-09-08 ENCOUNTER — Other Ambulatory Visit (HOSPITAL_COMMUNITY): Payer: Self-pay

## 2021-09-08 DIAGNOSIS — N939 Abnormal uterine and vaginal bleeding, unspecified: Secondary | ICD-10-CM | POA: Diagnosis not present

## 2021-09-08 MED ORDER — MEDROXYPROGESTERONE ACETATE 150 MG/ML IM SUSY
PREFILLED_SYRINGE | INTRAMUSCULAR | 3 refills | Status: DC
  Filled 2021-09-08: qty 1, 90d supply, fill #0

## 2021-09-09 ENCOUNTER — Other Ambulatory Visit (HOSPITAL_COMMUNITY): Payer: Self-pay

## 2021-09-10 ENCOUNTER — Other Ambulatory Visit (HOSPITAL_COMMUNITY): Payer: Self-pay

## 2021-09-10 ENCOUNTER — Encounter: Payer: Self-pay | Admitting: Family

## 2021-09-10 DIAGNOSIS — N809 Endometriosis, unspecified: Secondary | ICD-10-CM | POA: Diagnosis not present

## 2021-09-10 DIAGNOSIS — I10 Essential (primary) hypertension: Secondary | ICD-10-CM | POA: Diagnosis not present

## 2021-09-10 MED ORDER — BISOPROLOL FUMARATE 10 MG PO TABS
ORAL_TABLET | ORAL | 1 refills | Status: DC
  Filled 2021-09-10: qty 90, 90d supply, fill #0
  Filled 2022-01-20: qty 90, 90d supply, fill #1

## 2021-09-12 ENCOUNTER — Other Ambulatory Visit (HOSPITAL_COMMUNITY): Payer: Self-pay

## 2021-09-26 ENCOUNTER — Other Ambulatory Visit (HOSPITAL_COMMUNITY): Payer: Self-pay

## 2021-10-20 ENCOUNTER — Other Ambulatory Visit (HOSPITAL_COMMUNITY): Payer: Self-pay

## 2021-10-22 ENCOUNTER — Other Ambulatory Visit (HOSPITAL_COMMUNITY): Payer: Self-pay

## 2021-10-22 DIAGNOSIS — Z01419 Encounter for gynecological examination (general) (routine) without abnormal findings: Secondary | ICD-10-CM | POA: Diagnosis not present

## 2021-10-22 DIAGNOSIS — N920 Excessive and frequent menstruation with regular cycle: Secondary | ICD-10-CM | POA: Diagnosis not present

## 2021-10-22 DIAGNOSIS — N809 Endometriosis, unspecified: Secondary | ICD-10-CM | POA: Diagnosis not present

## 2021-10-22 MED ORDER — NAPROXEN 500 MG PO TABS
ORAL_TABLET | ORAL | 3 refills | Status: AC
  Filled 2021-10-22 – 2021-12-27 (×2): qty 45, 90d supply, fill #0
  Filled 2022-03-30: qty 45, 90d supply, fill #1

## 2021-10-22 MED ORDER — TRANEXAMIC ACID 650 MG PO TABS
ORAL_TABLET | ORAL | 3 refills | Status: DC
  Filled 2021-10-22: qty 90, 18d supply, fill #0
  Filled 2021-12-05: qty 30, 30d supply, fill #0
  Filled 2021-12-27: qty 30, 30d supply, fill #1
  Filled 2022-01-20: qty 30, 30d supply, fill #2
  Filled 2022-03-30: qty 30, 30d supply, fill #3

## 2021-11-11 ENCOUNTER — Other Ambulatory Visit (HOSPITAL_COMMUNITY): Payer: Self-pay

## 2021-11-18 ENCOUNTER — Other Ambulatory Visit: Payer: Self-pay

## 2021-11-18 ENCOUNTER — Other Ambulatory Visit (HOSPITAL_COMMUNITY): Payer: Self-pay

## 2021-11-18 ENCOUNTER — Emergency Department (HOSPITAL_BASED_OUTPATIENT_CLINIC_OR_DEPARTMENT_OTHER): Payer: 59

## 2021-11-18 ENCOUNTER — Other Ambulatory Visit (HOSPITAL_BASED_OUTPATIENT_CLINIC_OR_DEPARTMENT_OTHER): Payer: Self-pay

## 2021-11-18 ENCOUNTER — Emergency Department (HOSPITAL_BASED_OUTPATIENT_CLINIC_OR_DEPARTMENT_OTHER)
Admission: EM | Admit: 2021-11-18 | Discharge: 2021-11-18 | Disposition: A | Discharge status: Home / Self Care | Payer: 59 | Attending: Emergency Medicine | Admitting: Emergency Medicine

## 2021-11-18 ENCOUNTER — Encounter (HOSPITAL_BASED_OUTPATIENT_CLINIC_OR_DEPARTMENT_OTHER): Payer: Self-pay

## 2021-11-18 DIAGNOSIS — R1909 Other intra-abdominal and pelvic swelling, mass and lump: Secondary | ICD-10-CM

## 2021-11-18 DIAGNOSIS — R1900 Intra-abdominal and pelvic swelling, mass and lump, unspecified site: Secondary | ICD-10-CM | POA: Diagnosis not present

## 2021-11-18 DIAGNOSIS — R1013 Epigastric pain: Secondary | ICD-10-CM | POA: Diagnosis present

## 2021-11-18 DIAGNOSIS — R1033 Periumbilical pain: Secondary | ICD-10-CM | POA: Diagnosis not present

## 2021-11-18 DIAGNOSIS — Z91040 Latex allergy status: Secondary | ICD-10-CM | POA: Insufficient documentation

## 2021-11-18 DIAGNOSIS — R109 Unspecified abdominal pain: Secondary | ICD-10-CM | POA: Diagnosis not present

## 2021-11-18 HISTORY — DX: Endometriosis, unspecified: N80.9

## 2021-11-18 LAB — CBC
HCT: 37.1 % (ref 36.0–46.0)
Hemoglobin: 12.3 g/dL (ref 12.0–15.0)
MCH: 27.5 pg (ref 26.0–34.0)
MCHC: 33.2 g/dL (ref 30.0–36.0)
MCV: 82.8 fL (ref 80.0–100.0)
Platelets: 283 10*3/uL (ref 150–400)
RBC: 4.48 MIL/uL (ref 3.87–5.11)
RDW: 14.5 % (ref 11.5–15.5)
WBC: 5.5 10*3/uL (ref 4.0–10.5)
nRBC: 0 % (ref 0.0–0.2)

## 2021-11-18 LAB — COMPREHENSIVE METABOLIC PANEL
ALT: 8 U/L (ref 0–44)
AST: 10 U/L — ABNORMAL LOW (ref 15–41)
Albumin: 3.9 g/dL (ref 3.5–5.0)
Alkaline Phosphatase: 41 U/L (ref 38–126)
Anion gap: 10 (ref 5–15)
BUN: 11 mg/dL (ref 6–20)
CO2: 24 mmol/L (ref 22–32)
Calcium: 9.3 mg/dL (ref 8.9–10.3)
Chloride: 108 mmol/L (ref 98–111)
Creatinine, Ser: 0.97 mg/dL (ref 0.44–1.00)
GFR, Estimated: >60 mL/min (ref >60)
Glucose, Bld: 113 mg/dL — ABNORMAL HIGH (ref 70–99)
Potassium: 3.8 mmol/L (ref 3.5–5.1)
Sodium: 142 mmol/L (ref 135–145)
Total Bilirubin: 0.3 mg/dL (ref 0.3–1.2)
Total Protein: 6.5 g/dL (ref 6.5–8.1)

## 2021-11-18 LAB — URINALYSIS, ROUTINE W REFLEX MICROSCOPIC
Bilirubin Urine: NEGATIVE
Glucose, UA: NEGATIVE mg/dL
Hgb urine dipstick: NEGATIVE
Ketones, ur: NEGATIVE mg/dL
Leukocytes,Ua: NEGATIVE
Nitrite: NEGATIVE
Protein, ur: NEGATIVE mg/dL
Specific Gravity, Urine: 1.009 (ref 1.005–1.030)
pH: 6.5 (ref 5.0–8.0)

## 2021-11-18 LAB — PREGNANCY, URINE: Preg Test, Ur: NEGATIVE

## 2021-11-18 LAB — LIPASE, BLOOD: Lipase: 29 U/L (ref 11–51)

## 2021-11-18 MED ORDER — SENNOSIDES-DOCUSATE SODIUM 8.6-50 MG PO TABS
1.0000 | ORAL_TABLET | Freq: Every evening | ORAL | 0 refills | Status: AC | PRN
  Filled 2021-11-18: qty 100, 100d supply, fill #0

## 2021-11-18 MED ORDER — IOHEXOL 300 MG/ML  SOLN
100.0000 mL | Freq: Once | INTRAMUSCULAR | Status: AC | PRN
  Administered 2021-11-18: 80 mL via INTRAVENOUS

## 2021-11-18 MED ORDER — MORPHINE SULFATE (PF) 4 MG/ML IV SOLN
4.0000 mg | Freq: Once | INTRAVENOUS | Status: AC
  Administered 2021-11-18: 4 mg via INTRAVENOUS
  Filled 2021-11-18: qty 1

## 2021-11-18 MED ORDER — OXYCODONE-ACETAMINOPHEN 5-325 MG PO TABS
1.0000 | ORAL_TABLET | Freq: Four times a day (QID) | ORAL | 0 refills | Status: DC | PRN
  Filled 2021-11-18: qty 12, 3d supply, fill #0

## 2021-11-18 MED ORDER — SENNOSIDES-DOCUSATE SODIUM 8.6-50 MG PO TABS
1.0000 | ORAL_TABLET | Freq: Every evening | ORAL | 0 refills | Status: DC | PRN
  Filled 2021-11-18: qty 20, 20d supply, fill #0

## 2021-11-18 MED ORDER — ONDANSETRON HCL 4 MG/2ML IJ SOLN
4.0000 mg | Freq: Once | INTRAMUSCULAR | Status: AC
  Administered 2021-11-18: 4 mg via INTRAVENOUS
  Filled 2021-11-18: qty 2

## 2021-11-18 MED ORDER — SODIUM CHLORIDE 0.9 % IV BOLUS
1000.0000 mL | Freq: Once | INTRAVENOUS | Status: AC
  Administered 2021-11-18: 1000 mL via INTRAVENOUS

## 2021-11-18 NOTE — ED Provider Notes (Signed)
Blood pressure 131/85, pulse 67, temperature 98 F (36.7 C), temperature source Oral, resp. rate 18, last menstrual period 11/16/2021, SpO2 99 %.  Assuming care from Dr. Stark Jock.  In short, Carrie Maynard is a 41 y.o. female with a chief complaint of Abdominal Pain .  Refer to the original H&P for additional details.  The current plan of care is to follow up on remaining labs and CT abdomen/pelvis.  09:35 AM  Patient's labs showed no acute kidney injury.  Lipase normal.  No leukocytosis.  I independently evaluated the CT images and agree with radiology interpretation.  Discussed these with the patient and advised that she follow-up with her OB/GYN with concern for recurrent endometriosis.  She had surgery on this location last year and after biopsy was determined to be consistent with endometrial lesion.  Plan for pain management at home and she will call her OB/GYN today.    Margette Fast, MD 11/18/21 [id number]

## 2021-11-18 NOTE — Discharge Instructions (Signed)
You were seen in the emergency room today with abdominal pain.  You seem to have increased size of the umbilical mass.  Based on your surgery history this could be an endometrial lesion.  Please discuss this further with your OB/GYN to discuss management options as an outpatient.  I have sent over pain medication to your pharmacy.  This may cause constipation and can be habit-forming.  Do not take this if you will be drinking alcohol or taking other strong pain medicines or driving.

## 2021-11-18 NOTE — ED Provider Notes (Signed)
[facility] DE[facility] Provider Note   CSN: [id number] Arrival date & time: 11/18/21  6283     History  Chief Complaint  Patient presents with   Abdominal Pain    Carrie Maynard is a 41 y.o. female.  Patient is a 41 year old female with past medical history of endometriosis.  Patient presenting today with complaints of epigastric abdominal pain.  This began earlier yesterday in the absence of any injury or trauma.  She describes what she calls a "pulling sensation" to the inside of her abdomen adjacent to her umbilicus.  This is worse when she attempts to move certain directions.  She denies to me she is having any bowel or bladder complaints.  She denies any fevers or chills.  Last menstrual period was the beginning of the month and normal.  The history is provided by the patient.       Home Medications Prior to Admission medications   Medication Sig Start Date End Date Taking? Authorizing Provider  acetaminophen-codeine (TYLENOL #3) 300-30 MG tablet Take 1 tablet by mouth every 6 hours as needed for pain 10/01/20     acetaminophen-codeine (TYLENOL #3) 300-30 MG tablet Take 1 tablet by mouth every 6 hours as needed for pain 01/27/21     amoxicillin (AMOXIL) 500 MG capsule Take 1 capsule (500 mg total) by mouth 3 (three) times daily until complete Patient not taking: Reported on 10/14/2020 10/01/20     amoxicillin (AMOXIL) 500 MG capsule Take 1 capsule by mouth 3 times a day until finished * Start 1 day prior to procedure 01/19/21     amoxicillin (AMOXIL) 500 MG capsule Take 1 capsule by mouth 3 times a day until finished starting 1 day prior to procedure. 01/19/21     Apple Cider Vinegar 600 MG CAPS See admin instructions.    [provider]  APPLE CIDER VINEGAR PO Take 2 tablets by mouth daily.    [provider]  Ascorbic Acid (VITAMIN C WITH ROSE HIPS) 500 MG tablet Take 500 mg by mouth daily.    [provider]  bisoprolol (ZEBETA) 10  MG tablet Take 1 tablet by mouth once a day 09/10/21     bisoprolol (ZEBETA) 5 MG tablet TAKE 1 TABLET BY MOUTH ONCE A DAY 05/17/20 05/17/21  Deon Pilling, NP  bisoprolol (ZEBETA) 5 MG tablet TAKE 1 TABLET BY MOUTH DAILY Patient not taking: Reported on 10/14/2020 03/11/20 03/11/21  Janie Morning, DO  bisoprolol (ZEBETA) 5 MG tablet TAKE 1 TABLET BY MOUTH ONCE A DAY Patient not taking: Reported on 10/14/2020 07/25/20 07/25/21  Janie Morning, DO  Bisoprolol Fumarate (ZEBETA PO)     [provider]  chlorhexidine (PERIDEX) 0.12 % solution Swish 1/2 ounce in mouth for 2 full minutes then spit out --use twice daily 10/01/20     chlorhexidine (PERIDEX) 0.12 % solution Swish with 15 mls for 2 full minutes and spit twice daily as directed 01/27/21     Cholecalciferol (D3 VITAMIN PO) Take 2,500 Units by mouth daily.    [provider]  cholecalciferol (VITAMIN D3) 25 MCG (1000 UNIT) tablet 1 tablet    [provider]  ferrous gluconate (FERGON) 324 MG tablet Take 1 tablet (324 mg total) by mouth daily with breakfast. 04/25/18   Celso Amy, NP  fluconazole (DIFLUCAN) 150 MG tablet Take 1 tablet (150 mg total) by mouth at first sign of a yeast infection 1/51/76     folic acid (FOLVITE) 1 MG tablet  Take 1 tablet (1 mg total) by mouth daily. 11/24/18   Celso Amy, NP  medroxyPROGESTERone Acetate 150 MG/ML SUSY Inject 1 mL into the muscle every 3 months 09/08/21     Multiple Vitamin (MULTIVITAMIN ADULT PO) 1 tablet    [provider]  Multiple Vitamins-Minerals (MULTIVITAMIN ADULT PO) Take by mouth daily.    [provider]  naproxen (NAPROSYN) 500 MG tablet Take 1 tablet by mouth every 12 hours as needed during menses. Take with food or milk. 12/26/20     naproxen (NAPROSYN) 500 MG tablet Take 1 tablet every 12 hours with food or milk as needed during menses 10/22/21     norethindrone (AYGESTIN) 5 MG tablet Take 1 tablet (5 mg total) by mouth daily. 10/21/20     oxyCODONE  (OXY IR/ROXICODONE) 5 MG immediate release tablet Take 1 tablet by mouth every 6 hours as needed for moderate, severe or breakthrough pain. 09/04/20   Coralie Keens, MD  tranexamic acid (LYSTEDA) 650 MG TABS tablet Take 2 tablets by mouth 3 times daily for up to 5 days during menses only 12/26/20     tranexamic acid (LYSTEDA) 650 MG TABS tablet Take 2 tablets by mouth 3 times a day for up to 5 days during menses only 10/22/21         Allergies    Latex    Review of Systems   Review of Systems  All other systems reviewed and are negative.   Physical Exam Updated Vital Signs BP (!) 148/104   Pulse 81   Temp 98 F (36.7 C) (Oral)   Resp 18   LMP 11/16/2021   SpO2 99%  Physical Exam Vitals and nursing note reviewed.  Constitutional:      General: She is not in acute distress.    Appearance: She is well-developed. She is not diaphoretic.  HENT:     Head: Normocephalic and atraumatic.  Cardiovascular:     Rate and Rhythm: Normal rate and regular rhythm.     Heart sounds: No murmur heard.    No friction rub. No gallop.  Pulmonary:     Effort: Pulmonary effort is normal. No respiratory distress.     Breath sounds: Normal breath sounds. No wheezing.  Abdominal:     General: Bowel sounds are normal. There is no distension.     Palpations: Abdomen is soft.     Tenderness: There is abdominal tenderness in the periumbilical area. There is no right CVA tenderness, left CVA tenderness, guarding or rebound.     Comments: There is tenderness to palpation in the periumbilical region.  Musculoskeletal:        General: Normal range of motion.     Cervical back: Normal range of motion and neck supple.  Skin:    General: Skin is warm and dry.  Neurological:     General: No focal deficit present.     Mental Status: She is alert and oriented to person, place, and time.     ED Results / Procedures / Treatments   Labs (all labs ordered are listed, but only abnormal results are  displayed) Labs Reviewed  LIPASE, BLOOD  COMPREHENSIVE METABOLIC PANEL  CBC  URINALYSIS, ROUTINE W REFLEX MICROSCOPIC  PREGNANCY, URINE    EKG None  Radiology No results found.  Procedures Procedures  {Document cardiac monitor, telemetry assessment procedure when appropriate:1}  Medications Ordered in ED Medications  sodium chloride 0.9 % bolus 1,000 mL (has no administration in time range)  ondansetron (ZOFRAN) injection 4 mg (has no administration in time range)  morphine (PF) 4 MG/ML injection 4 mg (has no administration in time range)    ED Course/ Medical Decision Making/ A&P                           Medical Decision Making Amount and/or Complexity of Data Reviewed Labs: ordered. Radiology: ordered.  Risk Prescription drug management.   ***  {Document critical care time when appropriate:1} {Document review of labs and clinical decision tools ie heart score, Chads2Vasc2 etc:1}  {Document your independent review of radiology images, and any outside records:1} {Document your discussion with family members, caretakers, and with consultants:1} {Document social determinants of health affecting pt's care:1} {Document your decision making why or why not admission, treatments were needed:1} Final Clinical Impression(s) / ED Diagnoses Final diagnoses:  None    Rx / DC Orders ED Discharge Orders     None

## 2021-11-18 NOTE — ED Notes (Signed)
Patient verbalizes understanding of discharge instructions. Opportunity for questioning and answers were provided. Patient discharged from ED.  °

## 2021-11-18 NOTE — ED Triage Notes (Signed)
Pt presents POV with mid abd pain since last night, pt has a hx of endometriosis. Pt describes it as a pinching grabbing pain. Its intermittent, pt subsided through out the night, woke this am to use the bathroom and the pain came on again   Pt denies N/V or urinary symptoms

## 2021-11-24 ENCOUNTER — Other Ambulatory Visit: Payer: Self-pay | Admitting: Surgery

## 2021-11-24 ENCOUNTER — Other Ambulatory Visit (HOSPITAL_COMMUNITY): Payer: Self-pay

## 2021-11-24 DIAGNOSIS — N80129 Deep endometriosis of ovary, unspecified ovary: Secondary | ICD-10-CM | POA: Diagnosis not present

## 2021-11-24 MED ORDER — OXYCODONE HCL 5 MG PO TABS
ORAL_TABLET | ORAL | 0 refills | Status: DC
  Filled 2021-11-24: qty 25, 7d supply, fill #0

## 2021-11-25 ENCOUNTER — Encounter: Payer: Self-pay | Admitting: Family

## 2021-11-28 ENCOUNTER — Other Ambulatory Visit (HOSPITAL_COMMUNITY): Payer: Self-pay

## 2021-12-05 ENCOUNTER — Other Ambulatory Visit (HOSPITAL_COMMUNITY): Payer: Self-pay

## 2021-12-25 DIAGNOSIS — N939 Abnormal uterine and vaginal bleeding, unspecified: Secondary | ICD-10-CM | POA: Diagnosis not present

## 2021-12-25 DIAGNOSIS — N809 Endometriosis, unspecified: Secondary | ICD-10-CM | POA: Diagnosis not present

## 2021-12-25 DIAGNOSIS — N80C19 Endometriosis of the anterior abdominal wall, unspecified depth: Secondary | ICD-10-CM | POA: Diagnosis not present

## 2021-12-27 ENCOUNTER — Other Ambulatory Visit (HOSPITAL_COMMUNITY): Payer: Self-pay

## 2021-12-29 ENCOUNTER — Other Ambulatory Visit (HOSPITAL_COMMUNITY): Payer: Self-pay

## 2022-01-06 ENCOUNTER — Other Ambulatory Visit (HOSPITAL_COMMUNITY): Payer: Self-pay | Admitting: Student in an Organized Health Care Education/Training Program

## 2022-01-06 DIAGNOSIS — N80C19 Endometriosis of the anterior abdominal wall, unspecified depth: Secondary | ICD-10-CM

## 2022-01-08 ENCOUNTER — Ambulatory Visit (HOSPITAL_COMMUNITY)
Admission: RE | Admit: 2022-01-08 | Discharge: 2022-01-08 | Disposition: A | Discharge status: Home / Self Care | Payer: 59 | Source: Ambulatory Visit | Attending: Student in an Organized Health Care Education/Training Program | Admitting: Student in an Organized Health Care Education/Training Program

## 2022-01-08 DIAGNOSIS — R1905 Periumbilic swelling, mass or lump: Secondary | ICD-10-CM | POA: Diagnosis not present

## 2022-01-08 DIAGNOSIS — N80C19 Endometriosis of the anterior abdominal wall, unspecified depth: Secondary | ICD-10-CM | POA: Insufficient documentation

## 2022-01-08 MED ORDER — GADOBUTROL 1 MMOL/ML IV SOLN
9.0000 mL | Freq: Once | INTRAVENOUS | Status: AC | PRN
Start: 2022-01-08 — End: 2022-01-08
  Administered 2022-01-08: 9 mL via INTRAVENOUS

## 2022-01-14 DIAGNOSIS — N80C19 Endometriosis of the anterior abdominal wall, unspecified depth: Secondary | ICD-10-CM | POA: Diagnosis not present

## 2022-01-20 ENCOUNTER — Other Ambulatory Visit (HOSPITAL_COMMUNITY): Payer: Self-pay

## 2022-01-22 ENCOUNTER — Other Ambulatory Visit (HOSPITAL_COMMUNITY): Payer: Self-pay

## 2022-02-02 DIAGNOSIS — I10 Essential (primary) hypertension: Secondary | ICD-10-CM | POA: Diagnosis not present

## 2022-02-02 DIAGNOSIS — R946 Abnormal results of thyroid function studies: Secondary | ICD-10-CM | POA: Diagnosis not present

## 2022-02-02 DIAGNOSIS — R7309 Other abnormal glucose: Secondary | ICD-10-CM | POA: Diagnosis not present

## 2022-02-02 DIAGNOSIS — Z0000 Encounter for general adult medical examination without abnormal findings: Secondary | ICD-10-CM | POA: Diagnosis not present

## 2022-02-06 ENCOUNTER — Inpatient Hospital Stay (HOSPITAL_BASED_OUTPATIENT_CLINIC_OR_DEPARTMENT_OTHER): Payer: 59 | Admitting: Family

## 2022-02-06 ENCOUNTER — Inpatient Hospital Stay: Payer: 59 | Attending: Hematology & Oncology

## 2022-02-06 ENCOUNTER — Telehealth: Payer: Self-pay | Admitting: *Deleted

## 2022-02-06 ENCOUNTER — Encounter: Payer: Self-pay | Admitting: Family

## 2022-02-06 ENCOUNTER — Other Ambulatory Visit: Payer: Self-pay | Admitting: Family

## 2022-02-06 VITALS — BP 132/80 | HR 76 | Temp 98.3°F | Resp 18 | Wt 197.0 lb

## 2022-02-06 DIAGNOSIS — D573 Sickle-cell trait: Secondary | ICD-10-CM

## 2022-02-06 DIAGNOSIS — D509 Iron deficiency anemia, unspecified: Secondary | ICD-10-CM | POA: Insufficient documentation

## 2022-02-06 DIAGNOSIS — D500 Iron deficiency anemia secondary to blood loss (chronic): Secondary | ICD-10-CM | POA: Diagnosis not present

## 2022-02-06 LAB — CBC WITH DIFFERENTIAL (CANCER CENTER ONLY)
Abs Immature Granulocytes: 0.04 10*3/uL (ref 0.00–0.07)
Basophils Absolute: 0 10*3/uL (ref 0.0–0.1)
Basophils Relative: 1 %
Eosinophils Absolute: 0.1 10*3/uL (ref 0.0–0.5)
Eosinophils Relative: 2 %
HCT: 37.8 % (ref 36.0–46.0)
Hemoglobin: 12.7 g/dL (ref 12.0–15.0)
Immature Granulocytes: 1 %
Lymphocytes Relative: 44 %
Lymphs Abs: 2.5 10*3/uL (ref 0.7–4.0)
MCH: 28 pg (ref 26.0–34.0)
MCHC: 33.6 g/dL (ref 30.0–36.0)
MCV: 83.3 fL (ref 80.0–100.0)
Monocytes Absolute: 0.5 10*3/uL (ref 0.1–1.0)
Monocytes Relative: 9 %
Neutro Abs: 2.4 10*3/uL (ref 1.7–7.7)
Neutrophils Relative %: 43 %
Platelet Count: 279 10*3/uL (ref 150–400)
RBC: 4.54 MIL/uL (ref 3.87–5.11)
RDW: 12.6 % (ref 11.5–15.5)
WBC Count: 5.6 10*3/uL (ref 4.0–10.5)
nRBC: 0 % (ref 0.0–0.2)

## 2022-02-06 LAB — RETICULOCYTES
Immature Retic Fract: 13.7 % (ref 2.3–15.9)
RBC.: 4.52 MIL/uL (ref 3.87–5.11)
Retic Count, Absolute: 74.1 10*3/uL (ref 19.0–186.0)
Retic Ct Pct: 1.6 % (ref 0.4–3.1)

## 2022-02-06 LAB — FERRITIN: Ferritin: 15 ng/mL (ref 11–307)

## 2022-02-06 NOTE — Telephone Encounter (Signed)
Per 02/06/22 los - gave upcoming appointments - confirmed

## 2022-02-06 NOTE — Progress Notes (Signed)
Hematology and Oncology Follow Up Visit  Carrie Maynard [id number] 1980/06/02 41 y.o. 02/06/2022   Principle Diagnosis:  Iron deficiency anemia Sickle cell trait   Current Therapy:        Folic acid 1 mg PO daily PO iron supplement daily   Interim History:  Carrie Maynard is here today for follow-up. She is symptomatic with fatigue, dizziness when standing, swishing in ears, occasional palpitations and SOB with over exertion.  She takes a break to rest when needed.  She is having surgery for endometriosis on 04/16/2022.  Her cycles remains heavy and she takes Lysteda which helps shorten it to 5-6 days instead of 7.  No fever, chills, n/v, cough, rash, chest pain or changes in bowel or bladder habits.  No swelling, tenderness, numbness or tingling in her extremities.  No falls or syncope reported.  Appetite and hydration are good. Weight is stable at 197 lbs.   ECOG Performance Status: 1 - Symptomatic but completely ambulatory  Medications:  Allergies as of 02/06/2022       Reactions   Latex Rash, Other (See Comments)        Medication List        Accurate as of February 06, 2022  2:42 PM. If you have any questions, ask your nurse or doctor.          acetaminophen-codeine 300-30 MG tablet Commonly known as: TYLENOL #3 Take 1 tablet by mouth every 6 hours as needed for pain   acetaminophen-codeine 300-30 MG tablet Commonly known as: TYLENOL #3 Take 1 tablet by mouth every 6 hours as needed for pain   amoxicillin 500 MG capsule Commonly known as: AMOXIL Take 1 capsule (500 mg total) by mouth 3 (three) times daily until complete   amoxicillin 500 MG capsule Commonly known as: AMOXIL Take 1 capsule by mouth 3 times a day until finished * Start 1 day prior to procedure   amoxicillin 500 MG capsule Commonly known as: AMOXIL Take 1 capsule by mouth 3 times a day until finished starting 1 day prior to procedure.   Apple Cider Vinegar 600 MG Caps See admin  instructions.   APPLE CIDER VINEGAR PO Take 2 tablets by mouth daily.   ZEBETA PO   bisoprolol 5 MG tablet Commonly known as: ZEBETA TAKE 1 TABLET BY MOUTH DAILY   bisoprolol 5 MG tablet Commonly known as: ZEBETA TAKE 1 TABLET BY MOUTH ONCE A DAY   bisoprolol 5 MG tablet Commonly known as: ZEBETA TAKE 1 TABLET BY MOUTH ONCE A DAY   bisoprolol 10 MG tablet Commonly known as: ZEBETA Take 1 tablet by mouth once a day   bisoprolol 10 MG tablet Commonly known as: ZEBETA Take 1 tablet by mouth daily.   calcium carbonate 1250 (500 Ca) MG chewable tablet Commonly known as: OS-CAL Chew 1 tablet by mouth daily.   chlorhexidine 0.12 % solution Commonly known as: PERIDEX Swish 1/2 ounce in mouth for 2 full minutes then spit out --use twice daily   chlorhexidine 0.12 % solution Commonly known as: PERIDEX Swish with 15 mls for 2 full minutes and spit twice daily as directed   cyanocobalamin 100 MCG tablet Take 100 mcg by mouth daily.   cholecalciferol 25 MCG (1000 UNIT) tablet Commonly known as: VITAMIN D3 1 tablet   D3 VITAMIN PO Take 2,500 Units by mouth daily.   ferrous gluconate 324 MG tablet Commonly known as: FERGON Take 1 tablet (324 mg total) by mouth daily with breakfast.  fluconazole 150 MG tablet Commonly known as: DIFLUCAN Take 1 tablet (150 mg total) by mouth at first sign of a yeast infection   folic acid 1 MG tablet Commonly known as: FOLVITE Take 1 tablet (1 mg total) by mouth daily.   Magnesium Citrate 83 MG Chew Chew by mouth daily at 6 (six) AM.   medroxyPROGESTERone Acetate 150 MG/ML Susy Inject 1 mL into the muscle every 3 months   Melatonin 5 MG Chew Chew by mouth at bedtime.   MULTIVITAMIN ADULT PO 1 tablet   MULTIVITAMIN ADULT PO Take by mouth daily.   naproxen 500 MG tablet Commonly known as: NAPROSYN Take 1 tablet by mouth every 12 hours as needed during menses. Take with food or milk.   naproxen 500 MG tablet Commonly  known as: NAPROSYN Take 1 tablet every 12 hours with food or milk as needed during menses   norethindrone 5 MG tablet Commonly known as: AYGESTIN Take 1 tablet (5 mg total) by mouth daily.   oxyCODONE 5 MG immediate release tablet Commonly known as: Oxy IR/ROXICODONE Take 1 tablet by mouth every 6 hours as needed for moderate, severe or breakthrough pain.   oxyCODONE 5 MG immediate release tablet Commonly known as: Oxy IR/ROXICODONE Take 1 tablet (5 mg total) by mouth every 6 (six) hours as needed for pain   oxyCODONE-acetaminophen 5-325 MG tablet Commonly known as: PERCOCET/ROXICET Take 1 tablet by mouth every 6 hours as needed for severe pain.   Senna Plus 8.6-50 MG tablet Generic drug: senna-docusate Take 1 tablet by mouth at bedtime as needed for mild constipation.   tranexamic acid 650 MG Tabs tablet Commonly known as: Lysteda Take 2 tablets by mouth 3 times daily for up to 5 days during menses only   tranexamic acid 650 MG Tabs tablet Commonly known as: LYSTEDA Take 2 tablets by mouth 3 times a day for up to 5 days during menses only   ascorbic Acid 500 MG Cpcr Commonly known as: VITAMIN C Take 500 mg by mouth daily.   vitamin C with rose hips 500 MG tablet Take 500 mg by mouth daily.        Allergies:  Allergies  Allergen Reactions   Latex Rash and Other (See Comments)    Past Medical History, Surgical history, Social history, and Family History were reviewed and updated.  Review of Systems: All other 10 point review of systems is negative.   Physical Exam:  weight is 197 lb (89.4 kg). Her oral temperature is 98.3 F (36.8 C). Her blood pressure is 132/80 and her pulse is 76. Her respiration is 18 and oxygen saturation is 100%.   Wt Readings from Last 3 Encounters:  02/06/22 197 lb (89.4 kg)  10/14/20 197 lb (89.4 kg)  09/04/20 197 lb 12 oz (89.7 kg)    Ocular: Sclerae unicteric, pupils equal, round and reactive to light Ear-nose-throat:  Oropharynx clear, dentition fair Lymphatic: No cervical or supraclavicular adenopathy Lungs no rales or rhonchi, good excursion bilaterally Heart regular rate and rhythm, no murmur appreciated Abd soft, nontender, positive bowel sounds MSK no focal spinal tenderness, no joint edema Neuro: non-focal, well-oriented, appropriate affect Breasts: Deferred   Lab Results  Component Value Date   WBC 5.6 02/06/2022   HGB 12.7 02/06/2022   HCT 37.8 02/06/2022   MCV 83.3 02/06/2022   PLT 279 02/06/2022   Lab Results  Component Value Date   FERRITIN 14 10/14/2020   IRON 40 (L) 10/14/2020   TIBC  351 10/14/2020   UIBC 311 10/14/2020   IRONPCTSAT 11 (L) 10/14/2020   Lab Results  Component Value Date   RETICCTPCT 1.6 02/06/2022   RBC 4.54 02/06/2022   No results found for: "KPAFRELGTCHN", "LAMBDASER", "KAPLAMBRATIO" No results found for: "IGGSERUM", "IGA", "IGMSERUM" No results found for: "TOTALPROTELP", "ALBUMINELP", "A1GS", "A2GS", "BETS", "BETA2SER", "GAMS", "MSPIKE", "SPEI"   Chemistry      Component Value Date/Time   NA 142 11/18/2021 0543   NA 142 03/31/2017 0834   K 3.8 11/18/2021 0543   K 3.5 03/31/2017 0834   CL 108 11/18/2021 0543   CL 106 03/31/2017 0834   CO2 24 11/18/2021 0543   CO2 28 03/31/2017 0834   BUN 11 11/18/2021 0543   BUN 9 03/31/2017 0834   CREATININE 0.97 11/18/2021 0543   CREATININE 0.94 06/28/2017 0850   CREATININE 0.9 03/31/2017 0834      Component Value Date/Time   CALCIUM 9.3 11/18/2021 0543   CALCIUM 9.0 03/31/2017 0834   ALKPHOS 41 11/18/2021 0543   ALKPHOS 50 03/31/2017 0834   AST 10 (L) 11/18/2021 0543   AST 15 06/28/2017 0850   ALT 8 11/18/2021 0543   ALT 14 06/28/2017 0850   ALT 20 03/31/2017 0834   BILITOT 0.3 11/18/2021 0543   BILITOT 0.4 06/28/2017 0850       Impression and Plan: Carrie Maynard is a very pleasant 41 yo African American female with both iron deficiency anemia secondary to heavy cycles and the sickle cell trait. She  is doing well on folic acid and will continue her same regimen.  Iron studies are pending. We will replace if needed.  Follow-up in 4 months.   Lottie Dawson, NP 10/6/20232:42 PM

## 2022-02-09 ENCOUNTER — Other Ambulatory Visit: Payer: Self-pay | Admitting: Family

## 2022-02-09 DIAGNOSIS — R42 Dizziness and giddiness: Secondary | ICD-10-CM | POA: Diagnosis not present

## 2022-02-09 DIAGNOSIS — N809 Endometriosis, unspecified: Secondary | ICD-10-CM | POA: Diagnosis not present

## 2022-02-09 DIAGNOSIS — Z0000 Encounter for general adult medical examination without abnormal findings: Secondary | ICD-10-CM | POA: Diagnosis not present

## 2022-02-09 DIAGNOSIS — I10 Essential (primary) hypertension: Secondary | ICD-10-CM | POA: Diagnosis not present

## 2022-02-09 DIAGNOSIS — R7309 Other abnormal glucose: Secondary | ICD-10-CM | POA: Diagnosis not present

## 2022-02-09 DIAGNOSIS — R946 Abnormal results of thyroid function studies: Secondary | ICD-10-CM | POA: Diagnosis not present

## 2022-02-09 LAB — IRON AND IRON BINDING CAPACITY (CC-WL,HP ONLY)
Iron: 39 ug/dL (ref 28–170)
Saturation Ratios: 11 % (ref 10.4–31.8)
TIBC: 357 ug/dL (ref 250–450)
UIBC: 318 ug/dL (ref 148–442)

## 2022-03-18 DIAGNOSIS — H93293 Other abnormal auditory perceptions, bilateral: Secondary | ICD-10-CM | POA: Diagnosis not present

## 2022-03-18 DIAGNOSIS — R42 Dizziness and giddiness: Secondary | ICD-10-CM | POA: Diagnosis not present

## 2022-03-23 ENCOUNTER — Encounter: Payer: Self-pay | Admitting: Family

## 2022-03-30 ENCOUNTER — Other Ambulatory Visit (HOSPITAL_COMMUNITY): Payer: Self-pay

## 2022-04-02 DIAGNOSIS — Z124 Encounter for screening for malignant neoplasm of cervix: Secondary | ICD-10-CM | POA: Diagnosis not present

## 2022-04-02 DIAGNOSIS — I10 Essential (primary) hypertension: Secondary | ICD-10-CM | POA: Diagnosis not present

## 2022-04-02 DIAGNOSIS — R1033 Periumbilical pain: Secondary | ICD-10-CM | POA: Diagnosis not present

## 2022-04-02 DIAGNOSIS — N939 Abnormal uterine and vaginal bleeding, unspecified: Secondary | ICD-10-CM | POA: Diagnosis not present

## 2022-04-09 DIAGNOSIS — R42 Dizziness and giddiness: Secondary | ICD-10-CM | POA: Diagnosis not present

## 2022-04-14 DIAGNOSIS — L821 Other seborrheic keratosis: Secondary | ICD-10-CM | POA: Diagnosis not present

## 2022-04-16 ENCOUNTER — Encounter: Payer: Self-pay | Admitting: Family

## 2022-04-16 DIAGNOSIS — Z6831 Body mass index (BMI) 31.0-31.9, adult: Secondary | ICD-10-CM | POA: Diagnosis not present

## 2022-04-16 DIAGNOSIS — E669 Obesity, unspecified: Secondary | ICD-10-CM | POA: Diagnosis not present

## 2022-04-16 DIAGNOSIS — N8003 Adenomyosis of the uterus: Secondary | ICD-10-CM | POA: Diagnosis not present

## 2022-04-16 DIAGNOSIS — N80C2 Endometriosis of the umbilicus: Secondary | ICD-10-CM | POA: Diagnosis not present

## 2022-04-16 DIAGNOSIS — D259 Leiomyoma of uterus, unspecified: Secondary | ICD-10-CM | POA: Diagnosis not present

## 2022-04-16 DIAGNOSIS — N809 Endometriosis, unspecified: Secondary | ICD-10-CM | POA: Diagnosis not present

## 2022-04-16 DIAGNOSIS — Z98891 History of uterine scar from previous surgery: Secondary | ICD-10-CM | POA: Diagnosis not present

## 2022-04-16 DIAGNOSIS — R140 Abdominal distension (gaseous): Secondary | ICD-10-CM | POA: Diagnosis not present

## 2022-04-16 DIAGNOSIS — N80212 Superficial endometriosis of left fallopian tube: Secondary | ICD-10-CM | POA: Diagnosis not present

## 2022-04-16 DIAGNOSIS — N838 Other noninflammatory disorders of ovary, fallopian tube and broad ligament: Secondary | ICD-10-CM | POA: Diagnosis not present

## 2022-04-16 DIAGNOSIS — I10 Essential (primary) hypertension: Secondary | ICD-10-CM | POA: Diagnosis not present

## 2022-04-16 DIAGNOSIS — N939 Abnormal uterine and vaginal bleeding, unspecified: Secondary | ICD-10-CM | POA: Diagnosis not present

## 2022-04-17 DIAGNOSIS — Z6831 Body mass index (BMI) 31.0-31.9, adult: Secondary | ICD-10-CM | POA: Diagnosis not present

## 2022-04-17 DIAGNOSIS — R140 Abdominal distension (gaseous): Secondary | ICD-10-CM | POA: Diagnosis not present

## 2022-04-17 DIAGNOSIS — N838 Other noninflammatory disorders of ovary, fallopian tube and broad ligament: Secondary | ICD-10-CM | POA: Diagnosis not present

## 2022-04-17 DIAGNOSIS — D259 Leiomyoma of uterus, unspecified: Secondary | ICD-10-CM | POA: Diagnosis not present

## 2022-04-17 DIAGNOSIS — N8003 Adenomyosis of the uterus: Secondary | ICD-10-CM | POA: Diagnosis not present

## 2022-04-17 DIAGNOSIS — I10 Essential (primary) hypertension: Secondary | ICD-10-CM | POA: Diagnosis not present

## 2022-04-17 DIAGNOSIS — E669 Obesity, unspecified: Secondary | ICD-10-CM | POA: Diagnosis not present

## 2022-04-17 DIAGNOSIS — N80C2 Endometriosis of the umbilicus: Secondary | ICD-10-CM | POA: Diagnosis not present

## 2022-04-17 DIAGNOSIS — N939 Abnormal uterine and vaginal bleeding, unspecified: Secondary | ICD-10-CM | POA: Diagnosis not present

## 2022-04-18 ENCOUNTER — Encounter: Payer: Self-pay | Admitting: Family

## 2022-04-21 ENCOUNTER — Other Ambulatory Visit (HOSPITAL_COMMUNITY): Payer: Self-pay

## 2022-04-21 MED ORDER — BENZONATATE 200 MG PO CAPS
200.0000 mg | ORAL_CAPSULE | Freq: Three times a day (TID) | ORAL | 1 refills | Status: DC | PRN
  Filled 2022-04-21: qty 30, 10d supply, fill #0

## 2022-04-22 ENCOUNTER — Other Ambulatory Visit (HOSPITAL_COMMUNITY): Payer: Self-pay

## 2022-04-22 MED ORDER — BENZONATATE 200 MG PO CAPS
200.0000 mg | ORAL_CAPSULE | Freq: Three times a day (TID) | ORAL | 1 refills | Status: DC
  Filled 2022-04-22: qty 42, 14d supply, fill #0

## 2022-04-22 MED ORDER — PAXLOVID (300/100) 20 X 150 MG & 10 X 100MG PO TBPK
ORAL_TABLET | ORAL | 0 refills | Status: DC
  Filled 2022-04-22 (×2): qty 30, 5d supply, fill #0

## 2022-05-15 ENCOUNTER — Other Ambulatory Visit (HOSPITAL_COMMUNITY): Payer: Self-pay

## 2022-05-15 ENCOUNTER — Encounter: Payer: Self-pay | Admitting: Family

## 2022-05-15 MED ORDER — ERYTHROMYCIN 5 MG/GM OP OINT
TOPICAL_OINTMENT | OPHTHALMIC | 0 refills | Status: DC
  Filled 2022-05-15: qty 3.5, 10d supply, fill #0

## 2022-05-25 ENCOUNTER — Other Ambulatory Visit (HOSPITAL_COMMUNITY): Payer: Self-pay

## 2022-05-25 MED ORDER — NORETHINDRONE ACETATE 5 MG PO TABS
5.0000 mg | ORAL_TABLET | Freq: Every day | ORAL | 3 refills | Status: DC
  Filled 2022-05-25: qty 30, 30d supply, fill #0

## 2022-06-09 ENCOUNTER — Encounter: Payer: Self-pay | Admitting: Family

## 2022-06-09 ENCOUNTER — Inpatient Hospital Stay (HOSPITAL_BASED_OUTPATIENT_CLINIC_OR_DEPARTMENT_OTHER): Payer: BC Managed Care – PPO | Admitting: Family

## 2022-06-09 ENCOUNTER — Other Ambulatory Visit: Payer: Self-pay

## 2022-06-09 ENCOUNTER — Inpatient Hospital Stay: Payer: BC Managed Care – PPO | Attending: Hematology & Oncology

## 2022-06-09 VITALS — BP 136/88 | HR 85 | Temp 98.6°F | Resp 18 | Ht 66.0 in | Wt 201.0 lb

## 2022-06-09 DIAGNOSIS — D573 Sickle-cell trait: Secondary | ICD-10-CM | POA: Diagnosis not present

## 2022-06-09 DIAGNOSIS — D500 Iron deficiency anemia secondary to blood loss (chronic): Secondary | ICD-10-CM | POA: Insufficient documentation

## 2022-06-09 DIAGNOSIS — N920 Excessive and frequent menstruation with regular cycle: Secondary | ICD-10-CM | POA: Insufficient documentation

## 2022-06-09 LAB — IRON AND IRON BINDING CAPACITY (CC-WL,HP ONLY)
Iron: 100 ug/dL (ref 28–170)
Saturation Ratios: 29 % (ref 10.4–31.8)
TIBC: 346 ug/dL (ref 250–450)
UIBC: 246 ug/dL (ref 148–442)

## 2022-06-09 LAB — CMP (CANCER CENTER ONLY)
ALT: 10 U/L (ref 0–44)
AST: 10 U/L — ABNORMAL LOW (ref 15–41)
Albumin: 4.4 g/dL (ref 3.5–5.0)
Alkaline Phosphatase: 50 U/L (ref 38–126)
Anion gap: 9 (ref 5–15)
BUN: 9 mg/dL (ref 6–20)
CO2: 27 mmol/L (ref 22–32)
Calcium: 10 mg/dL (ref 8.9–10.3)
Chloride: 105 mmol/L (ref 98–111)
Creatinine: 1.01 mg/dL — ABNORMAL HIGH (ref 0.44–1.00)
GFR, Estimated: >60 mL/min (ref >60)
Glucose, Bld: 99 mg/dL (ref 70–99)
Potassium: 4.2 mmol/L (ref 3.5–5.1)
Sodium: 141 mmol/L (ref 135–145)
Total Bilirubin: 0.4 mg/dL (ref 0.3–1.2)
Total Protein: 8 g/dL (ref 6.5–8.1)

## 2022-06-09 LAB — CBC WITH DIFFERENTIAL (CANCER CENTER ONLY)
Abs Immature Granulocytes: 0.01 10*3/uL (ref 0.00–0.07)
Basophils Absolute: 0 10*3/uL (ref 0.0–0.1)
Basophils Relative: 1 %
Eosinophils Absolute: 0.1 10*3/uL (ref 0.0–0.5)
Eosinophils Relative: 2 %
HCT: 39.8 % (ref 36.0–46.0)
Hemoglobin: 13 g/dL (ref 12.0–15.0)
Immature Granulocytes: 0 %
Lymphocytes Relative: 38 %
Lymphs Abs: 1.8 10*3/uL (ref 0.7–4.0)
MCH: 27.4 pg (ref 26.0–34.0)
MCHC: 32.7 g/dL (ref 30.0–36.0)
MCV: 84 fL (ref 80.0–100.0)
Monocytes Absolute: 0.4 10*3/uL (ref 0.1–1.0)
Monocytes Relative: 8 %
Neutro Abs: 2.5 10*3/uL (ref 1.7–7.7)
Neutrophils Relative %: 51 %
Platelet Count: 307 10*3/uL (ref 150–400)
RBC: 4.74 MIL/uL (ref 3.87–5.11)
RDW: 13 % (ref 11.5–15.5)
WBC Count: 4.8 10*3/uL (ref 4.0–10.5)
nRBC: 0 % (ref 0.0–0.2)

## 2022-06-09 LAB — FERRITIN: Ferritin: 21 ng/mL (ref 11–307)

## 2022-06-09 NOTE — Progress Notes (Signed)
Hematology and Oncology Follow Up Visit  Carrie Maynard [id number] [birth year] 42 y.o. 06/09/2022   Principle Diagnosis:  Iron deficiency anemia Sickle cell trait   Current Therapy:        Folic acid 1 mg PO daily PO iron supplement daily   Interim History:  Carrie Maynard is here today for follow-up. She had a partial hysterectomy with umbilical endometrial excision.  She has recuperated nicely and is feeling good.  She has not noted any blood loss. No bruising or petechiae.  No fatigue. No fever, chills, n/v, cough, rash, dizziness, SOB, chest pain, palpitations, abdominal pain or changes in bowel or bladder habits.  No swelling, tenderness, numbness or tingling in her extremities.  No falls or syncope.  Appetite and hydration are good. Weight is stable at 201 lbs.   ECOG Performance Status: 0 - Asymptomatic  Medications:  Allergies as of 06/09/2022       Reactions   Latex Rash, Other (See Comments)        Medication List        Accurate as of June 09, 2022 10:36 AM. If you have any questions, ask your nurse or doctor.          STOP taking these medications    amoxicillin 500 MG capsule Commonly known as: AMOXIL Stopped by: Lottie Dawson, NP   benzonatate 200 MG capsule Commonly known as: TESSALON Stopped by: Lottie Dawson, NP   chlorhexidine 0.12 % solution Commonly known as: PERIDEX Stopped by: Lottie Dawson, NP   erythromycin ophthalmic ointment Stopped by: Lottie Dawson, NP   fluconazole 150 MG tablet Commonly known as: DIFLUCAN Stopped by: Lottie Dawson, NP   Paxlovid (300/100) 20 x 150 MG & 10 x '100MG'$  Tbpk Generic drug: nirmatrelvir & ritonavir Stopped by: Lottie Dawson, NP       TAKE these medications    acetaminophen-codeine 300-30 MG tablet Commonly known as: TYLENOL #3 Take 1 tablet by mouth every 6 hours as needed for pain What changed: Another medication with the same name was removed. Continue taking this medication, and follow the  directions you see here. Changed by: Lottie Dawson, NP   APPLE CIDER VINEGAR PO Take 2 tablets by mouth daily. What changed: Another medication with the same name was removed. Continue taking this medication, and follow the directions you see here. Changed by: Lottie Dawson, NP   bisoprolol 5 MG tablet Commonly known as: ZEBETA TAKE 1 TABLET BY MOUTH ONCE A DAY What changed: Another medication with the same name was removed. Continue taking this medication, and follow the directions you see here. Changed by: Lottie Dawson, NP   bisoprolol 5 MG tablet Commonly known as: ZEBETA TAKE 1 TABLET BY MOUTH ONCE A DAY What changed: Another medication with the same name was removed. Continue taking this medication, and follow the directions you see here. Changed by: Lottie Dawson, NP   calcium carbonate 1250 (500 Ca) MG chewable tablet Commonly known as: OS-CAL Chew 1 tablet by mouth daily.   cyanocobalamin 100 MCG tablet Take 100 mcg by mouth daily.   D3 VITAMIN PO Take 2,500 Units by mouth daily. What changed: Another medication with the same name was removed. Continue taking this medication, and follow the directions you see here. Changed by: Lottie Dawson, NP   ferrous gluconate 324 MG tablet Commonly known as: FERGON Take 1 tablet (324 mg total) by mouth daily with breakfast.   folic acid 1 MG tablet Commonly known as: FOLVITE Take 1 tablet (  1 mg total) by mouth daily.   Magnesium Citrate 83 MG Chew Chew by mouth daily at 6 (six) AM.   medroxyPROGESTERone Acetate 150 MG/ML Susy Inject 1 mL into the muscle every 3 months   Melatonin 5 MG Chew Chew by mouth at bedtime.   MULTIVITAMIN ADULT PO 1 tablet   MULTIVITAMIN ADULT PO Take by mouth daily.   naproxen 500 MG tablet Commonly known as: NAPROSYN Take 1 tablet every 12 hours with food or milk as needed during menses What changed: Another medication with the same name was removed. Continue taking this medication, and  follow the directions you see here. Changed by: Lottie Dawson, NP   norethindrone 5 MG tablet Commonly known as: AYGESTIN Take 1 tablet (5 mg total) by mouth daily.   norethindrone 5 MG tablet Commonly known as: AYGESTIN Take 1 tablet (5 mg total) by mouth daily.   oxyCODONE 5 MG immediate release tablet Commonly known as: Oxy IR/ROXICODONE Take 1 tablet by mouth every 6 hours as needed for moderate, severe or breakthrough pain.   oxyCODONE 5 MG immediate release tablet Commonly known as: Oxy IR/ROXICODONE Take 1 tablet (5 mg total) by mouth every 6 (six) hours as needed for pain   oxyCODONE-acetaminophen 5-325 MG tablet Commonly known as: PERCOCET/ROXICET Take 1 tablet by mouth every 6 hours as needed for severe pain.   Senna Plus 8.6-50 MG tablet Generic drug: senna-docusate Take 1 tablet by mouth at bedtime as needed for mild constipation.   tranexamic acid 650 MG Tabs tablet Commonly known as: Lysteda Take 2 tablets by mouth 3 times daily for up to 5 days during menses only   tranexamic acid 650 MG Tabs tablet Commonly known as: LYSTEDA Take 2 tablets by mouth 3 times a day for up to 5 days during menses only   vitamin C with rose hips 500 MG tablet Take 500 mg by mouth daily. What changed: Another medication with the same name was removed. Continue taking this medication, and follow the directions you see here. Changed by: Lottie Dawson, NP        Allergies:  Allergies  Allergen Reactions   Latex Rash and Other (See Comments)    Past Medical History, Surgical history, Social history, and Family History were reviewed and updated.  Review of Systems: All other 10 point review of systems is negative.   Physical Exam:  vitals were not taken for this visit.   Wt Readings from Last 3 Encounters:  02/06/22 197 lb (89.4 kg)  10/14/20 197 lb (89.4 kg)  09/04/20 197 lb 12 oz (89.7 kg)    Ocular: Sclerae unicteric, pupils equal, round and reactive to  light Ear-nose-throat: Oropharynx clear, dentition fair Lymphatic: No cervical or supraclavicular adenopathy Lungs no rales or rhonchi, good excursion bilaterally Heart regular rate and rhythm, no murmur appreciated Abd soft, nontender, positive bowel sounds MSK no focal spinal tenderness, no joint edema Neuro: non-focal, well-oriented, appropriate affect Breasts: Deferred   Lab Results  Component Value Date   WBC 4.8 06/09/2022   HGB 13.0 06/09/2022   HCT 39.8 06/09/2022   MCV 84.0 06/09/2022   PLT 307 06/09/2022   Lab Results  Component Value Date   FERRITIN 15 02/06/2022   IRON 39 02/06/2022   TIBC 357 02/06/2022   UIBC 318 02/06/2022   IRONPCTSAT 11 02/06/2022   Lab Results  Component Value Date   RETICCTPCT 1.6 02/06/2022   RBC 4.74 06/09/2022   No results found for: "KPAFRELGTCHN", "LAMBDASER", "  KAPLAMBRATIO" No results found for: "IGGSERUM", "IGA", "IGMSERUM" No results found for: "TOTALPROTELP", "ALBUMINELP", "A1GS", "A2GS", "BETS", "BETA2SER", "GAMS", "MSPIKE", "SPEI"   Chemistry      Component Value Date/Time   NA 142 11/18/2021 0543   NA 142 03/31/2017 0834   K 3.8 11/18/2021 0543   K 3.5 03/31/2017 0834   CL 108 11/18/2021 0543   CL 106 03/31/2017 0834   CO2 24 11/18/2021 0543   CO2 28 03/31/2017 0834   BUN 11 11/18/2021 0543   BUN 9 03/31/2017 0834   CREATININE 0.97 11/18/2021 0543   CREATININE 0.94 06/28/2017 0850   CREATININE 0.9 03/31/2017 0834      Component Value Date/Time   CALCIUM 9.3 11/18/2021 0543   CALCIUM 9.0 03/31/2017 0834   ALKPHOS 41 11/18/2021 0543   ALKPHOS 50 03/31/2017 0834   AST 10 (L) 11/18/2021 0543   AST 15 06/28/2017 0850   ALT 8 11/18/2021 0543   ALT 14 06/28/2017 0850   ALT 20 03/31/2017 0834   BILITOT 0.3 11/18/2021 0543   BILITOT 0.4 06/28/2017 0850       Impression and Plan: Ms. Witz is a very pleasant 42 yo African American female with both iron deficiency anemia secondary to heavy cycles and the sickle  cell trait. She will continue her same regimen with daily folic acid.  Iron studies are pending. We will replace if needed.  We will now follow-up as needed.   Lottie Dawson, NP 2/6/202410:36 AM

## 2022-06-24 ENCOUNTER — Other Ambulatory Visit (HOSPITAL_COMMUNITY): Payer: Self-pay

## 2022-06-24 MED ORDER — BISOPROLOL FUMARATE 5 MG PO TABS
5.0000 mg | ORAL_TABLET | Freq: Every day | ORAL | 3 refills | Status: AC
  Filled 2022-06-24: qty 90, 90d supply, fill #0
  Filled 2022-10-06: qty 90, 90d supply, fill #1
  Filled 2022-11-10 – 2022-11-16 (×2): qty 90, 90d supply, fill #2

## 2022-06-25 ENCOUNTER — Other Ambulatory Visit (HOSPITAL_COMMUNITY): Payer: Self-pay

## 2022-06-25 MED ORDER — NORETHINDRONE ACETATE 5 MG PO TABS
5.0000 mg | ORAL_TABLET | Freq: Every day | ORAL | 3 refills | Status: DC
  Filled 2022-06-25: qty 30, 30d supply, fill #0
  Filled 2022-07-30: qty 30, 30d supply, fill #1
  Filled 2022-09-07: qty 30, 30d supply, fill #2
  Filled 2022-10-06: qty 30, 30d supply, fill #3

## 2022-07-05 ENCOUNTER — Other Ambulatory Visit (HOSPITAL_COMMUNITY): Payer: Self-pay

## 2022-07-21 ENCOUNTER — Encounter: Payer: Self-pay | Admitting: Family

## 2022-07-30 ENCOUNTER — Other Ambulatory Visit (HOSPITAL_COMMUNITY): Payer: Self-pay

## 2022-07-31 ENCOUNTER — Other Ambulatory Visit (HOSPITAL_COMMUNITY): Payer: Self-pay

## 2022-08-17 ENCOUNTER — Encounter: Payer: Self-pay | Admitting: Family

## 2022-08-20 ENCOUNTER — Encounter: Payer: Self-pay | Admitting: Family

## 2022-09-02 ENCOUNTER — Encounter: Payer: Self-pay | Admitting: Family

## 2022-09-08 ENCOUNTER — Other Ambulatory Visit: Payer: Self-pay

## 2022-09-16 ENCOUNTER — Other Ambulatory Visit: Payer: Self-pay | Admitting: Family Medicine

## 2022-09-16 DIAGNOSIS — R221 Localized swelling, mass and lump, neck: Secondary | ICD-10-CM

## 2022-10-06 ENCOUNTER — Other Ambulatory Visit (HOSPITAL_COMMUNITY): Payer: Self-pay

## 2022-10-06 ENCOUNTER — Ambulatory Visit
Admission: RE | Admit: 2022-10-06 | Discharge: 2022-10-06 | Disposition: A | Discharge status: Home / Self Care | Payer: BC Managed Care – PPO | Source: Ambulatory Visit | Attending: Family Medicine | Admitting: Family Medicine

## 2022-10-06 DIAGNOSIS — R221 Localized swelling, mass and lump, neck: Secondary | ICD-10-CM

## 2022-11-10 ENCOUNTER — Other Ambulatory Visit (HOSPITAL_COMMUNITY): Payer: Self-pay

## 2022-11-11 ENCOUNTER — Other Ambulatory Visit: Payer: Self-pay

## 2022-11-12 ENCOUNTER — Other Ambulatory Visit (HOSPITAL_COMMUNITY): Payer: Self-pay

## 2022-11-12 ENCOUNTER — Other Ambulatory Visit: Payer: Self-pay

## 2022-11-12 MED ORDER — NORETHINDRONE ACETATE 5 MG PO TABS
5.0000 mg | ORAL_TABLET | Freq: Every day | ORAL | 3 refills | Status: DC
  Filled 2022-11-12: qty 30, 30d supply, fill #0
  Filled 2022-12-14: qty 90, 90d supply, fill #1

## 2022-11-16 ENCOUNTER — Other Ambulatory Visit (HOSPITAL_COMMUNITY): Payer: Self-pay

## 2022-12-14 ENCOUNTER — Other Ambulatory Visit (HOSPITAL_COMMUNITY): Payer: Self-pay

## 2022-12-14 ENCOUNTER — Other Ambulatory Visit: Payer: Self-pay

## 2022-12-14 MED ORDER — BISOPROLOL FUMARATE 5 MG PO TABS
5.0000 mg | ORAL_TABLET | Freq: Every day | ORAL | 1 refills | Status: DC
  Filled 2023-06-29: qty 90, 90d supply, fill #0
  Filled 2023-09-28 – 2023-09-29 (×2): qty 90, 90d supply, fill #1

## 2022-12-14 MED ORDER — BISOPROLOL FUMARATE 5 MG PO TABS
5.0000 mg | ORAL_TABLET | Freq: Every day | ORAL | 1 refills | Status: AC
  Filled 2023-02-17 – 2023-02-19 (×2): qty 90, 90d supply, fill #0
  Filled 2023-04-29: qty 90, 90d supply, fill #1
  Filled 2023-04-29: qty 30, 30d supply, fill #1

## 2023-02-17 ENCOUNTER — Other Ambulatory Visit (HOSPITAL_COMMUNITY): Payer: Self-pay

## 2023-02-18 ENCOUNTER — Other Ambulatory Visit: Payer: Self-pay

## 2023-02-18 ENCOUNTER — Other Ambulatory Visit (HOSPITAL_COMMUNITY): Payer: Self-pay

## 2023-02-18 ENCOUNTER — Encounter: Payer: Self-pay | Admitting: Pharmacist

## 2023-02-18 MED ORDER — NORETHINDRONE ACETATE 5 MG PO TABS
5.0000 mg | ORAL_TABLET | Freq: Every day | ORAL | 3 refills | Status: DC
  Filled 2023-02-18 – 2023-03-23 (×3): qty 30, 30d supply, fill #0
  Filled 2023-04-29 (×2): qty 30, 30d supply, fill #1
  Filled 2023-05-24: qty 30, 30d supply, fill #2
  Filled 2023-06-29: qty 30, 30d supply, fill #3

## 2023-02-19 ENCOUNTER — Other Ambulatory Visit (HOSPITAL_COMMUNITY): Payer: Self-pay

## 2023-02-19 ENCOUNTER — Other Ambulatory Visit: Payer: Self-pay

## 2023-03-23 ENCOUNTER — Other Ambulatory Visit: Payer: Self-pay

## 2023-03-23 ENCOUNTER — Other Ambulatory Visit (HOSPITAL_COMMUNITY): Payer: Self-pay

## 2023-04-29 ENCOUNTER — Other Ambulatory Visit (HOSPITAL_COMMUNITY): Payer: Self-pay

## 2023-04-29 ENCOUNTER — Other Ambulatory Visit: Payer: Self-pay

## 2023-04-30 ENCOUNTER — Other Ambulatory Visit: Payer: Self-pay

## 2023-05-03 ENCOUNTER — Encounter: Payer: Self-pay | Admitting: Family

## 2023-05-14 ENCOUNTER — Other Ambulatory Visit: Payer: Self-pay | Admitting: Family Medicine

## 2023-05-14 DIAGNOSIS — Z0000 Encounter for general adult medical examination without abnormal findings: Secondary | ICD-10-CM

## 2023-05-24 ENCOUNTER — Other Ambulatory Visit: Payer: Self-pay

## 2023-06-01 ENCOUNTER — Ambulatory Visit
Admission: RE | Admit: 2023-06-01 | Discharge: 2023-06-01 | Disposition: A | Discharge status: Home / Self Care | Payer: 59 | Source: Ambulatory Visit | Attending: Family Medicine | Admitting: Family Medicine

## 2023-06-01 ENCOUNTER — Encounter: Payer: Self-pay | Admitting: Family

## 2023-06-01 DIAGNOSIS — Z0000 Encounter for general adult medical examination without abnormal findings: Secondary | ICD-10-CM

## 2023-06-22 ENCOUNTER — Other Ambulatory Visit (HOSPITAL_COMMUNITY): Payer: Self-pay

## 2023-06-22 MED ORDER — AMOXICILLIN 500 MG PO CAPS
500.0000 mg | ORAL_CAPSULE | Freq: Three times a day (TID) | ORAL | 0 refills | Status: DC
  Filled 2023-06-22: qty 21, 7d supply, fill #0

## 2023-06-22 MED ORDER — FLUCONAZOLE 150 MG PO TABS
150.0000 mg | ORAL_TABLET | Freq: Every day | ORAL | 0 refills | Status: AC
  Filled 2023-06-22: qty 2, 2d supply, fill #0

## 2023-06-29 ENCOUNTER — Other Ambulatory Visit: Payer: Self-pay

## 2023-06-29 ENCOUNTER — Other Ambulatory Visit (HOSPITAL_COMMUNITY): Payer: Self-pay

## 2023-06-29 MED ORDER — AMOXICILLIN 500 MG PO CAPS
500.0000 mg | ORAL_CAPSULE | Freq: Three times a day (TID) | ORAL | 0 refills | Status: AC
  Filled 2023-06-29: qty 21, 7d supply, fill #0

## 2023-07-05 ENCOUNTER — Other Ambulatory Visit (HOSPITAL_COMMUNITY): Payer: Self-pay

## 2023-07-05 MED ORDER — HYDROCODONE BIT-HOMATROP MBR 5-1.5 MG/5ML PO SOLN
5.0000 mL | ORAL | 0 refills | Status: AC | PRN
  Filled 2023-07-05: qty 200, 10d supply, fill #0

## 2023-07-05 MED ORDER — PREDNISONE 20 MG PO TABS
20.0000 mg | ORAL_TABLET | Freq: Every morning | ORAL | 0 refills | Status: AC
  Filled 2023-07-05: qty 5, 5d supply, fill #0

## 2023-07-05 MED ORDER — AZITHROMYCIN 250 MG PO TABS
ORAL_TABLET | ORAL | 0 refills | Status: AC
  Filled 2023-07-05: qty 6, 5d supply, fill #0

## 2023-07-06 ENCOUNTER — Other Ambulatory Visit: Payer: Self-pay

## 2023-07-21 ENCOUNTER — Other Ambulatory Visit (HOSPITAL_COMMUNITY): Payer: Self-pay

## 2023-07-21 ENCOUNTER — Other Ambulatory Visit: Payer: Self-pay

## 2023-07-21 MED ORDER — DOXYCYCLINE HYCLATE 100 MG PO CAPS
100.0000 mg | ORAL_CAPSULE | Freq: Two times a day (BID) | ORAL | 0 refills | Status: AC
  Filled 2023-07-21: qty 20, 10d supply, fill #0

## 2023-08-02 ENCOUNTER — Other Ambulatory Visit (HOSPITAL_COMMUNITY): Payer: Self-pay

## 2023-08-07 ENCOUNTER — Other Ambulatory Visit (HOSPITAL_COMMUNITY): Payer: Self-pay

## 2023-08-24 ENCOUNTER — Other Ambulatory Visit (HOSPITAL_COMMUNITY): Payer: Self-pay

## 2023-09-13 ENCOUNTER — Other Ambulatory Visit (HOSPITAL_COMMUNITY): Payer: Self-pay

## 2023-09-28 ENCOUNTER — Other Ambulatory Visit: Payer: Self-pay

## 2023-09-28 ENCOUNTER — Other Ambulatory Visit (HOSPITAL_COMMUNITY): Payer: Self-pay

## 2023-09-29 ENCOUNTER — Other Ambulatory Visit: Payer: Self-pay

## 2023-09-29 ENCOUNTER — Other Ambulatory Visit (HOSPITAL_COMMUNITY): Payer: Self-pay

## 2023-09-29 ENCOUNTER — Other Ambulatory Visit (HOSPITAL_BASED_OUTPATIENT_CLINIC_OR_DEPARTMENT_OTHER): Payer: Self-pay

## 2023-09-29 ENCOUNTER — Encounter: Payer: Self-pay | Admitting: Pharmacist

## 2023-09-29 MED ORDER — AMOXICILLIN 500 MG PO CAPS
500.0000 mg | ORAL_CAPSULE | Freq: Three times a day (TID) | ORAL | 0 refills | Status: AC
  Filled 2023-09-29 – 2023-09-30 (×2): qty 21, 7d supply, fill #0

## 2023-09-29 MED ORDER — NORETHINDRONE ACETATE 5 MG PO TABS
5.0000 mg | ORAL_TABLET | Freq: Every day | ORAL | 3 refills | Status: AC
  Filled 2023-09-29: qty 30, 30d supply, fill #0

## 2023-09-29 MED ORDER — FLUCONAZOLE 150 MG PO TABS
150.0000 mg | ORAL_TABLET | ORAL | 0 refills | Status: AC
  Filled 2023-09-29 – 2023-09-30 (×2): qty 1, 1d supply, fill #0

## 2023-09-30 ENCOUNTER — Other Ambulatory Visit (HOSPITAL_COMMUNITY): Payer: Self-pay

## 2023-10-11 ENCOUNTER — Other Ambulatory Visit (HOSPITAL_COMMUNITY): Payer: Self-pay

## 2023-10-12 ENCOUNTER — Other Ambulatory Visit (HOSPITAL_COMMUNITY): Payer: Self-pay

## 2023-10-12 ENCOUNTER — Other Ambulatory Visit: Payer: Self-pay

## 2023-10-12 MED ORDER — ACETAMINOPHEN-CODEINE 300-30 MG PO TABS
1.0000 | ORAL_TABLET | Freq: Four times a day (QID) | ORAL | 0 refills | Status: DC | PRN
  Filled 2023-10-12: qty 12, 3d supply, fill #0

## 2023-10-25 ENCOUNTER — Other Ambulatory Visit (HOSPITAL_COMMUNITY): Payer: Self-pay

## 2023-10-25 MED ORDER — ACETAMINOPHEN-CODEINE 300-30 MG PO TABS
1.0000 | ORAL_TABLET | Freq: Four times a day (QID) | ORAL | 0 refills | Status: AC | PRN
  Filled 2023-10-25: qty 12, 3d supply, fill #0

## 2023-10-26 ENCOUNTER — Other Ambulatory Visit (HOSPITAL_COMMUNITY): Payer: Self-pay

## 2023-10-26 ENCOUNTER — Other Ambulatory Visit: Payer: Self-pay

## 2023-10-26 MED ORDER — METHYLPREDNISOLONE 4 MG PO TBPK
ORAL_TABLET | ORAL | 0 refills | Status: AC
  Filled 2023-10-26: qty 21, 6d supply, fill #0

## 2023-10-27 ENCOUNTER — Other Ambulatory Visit (HOSPITAL_COMMUNITY): Payer: Self-pay

## 2023-12-29 ENCOUNTER — Other Ambulatory Visit (HOSPITAL_COMMUNITY): Payer: Self-pay

## 2023-12-29 MED ORDER — BISOPROLOL FUMARATE 5 MG PO TABS
5.0000 mg | ORAL_TABLET | Freq: Every day | ORAL | 2 refills | Status: AC
  Filled 2023-12-29: qty 90, 90d supply, fill #0
  Filled 2024-04-24 – 2024-04-28 (×2): qty 90, 90d supply, fill #1

## 2023-12-30 ENCOUNTER — Other Ambulatory Visit: Payer: Self-pay

## 2024-02-11 ENCOUNTER — Other Ambulatory Visit (HOSPITAL_COMMUNITY): Payer: Self-pay

## 2024-02-11 ENCOUNTER — Encounter: Payer: Self-pay | Admitting: Family

## 2024-02-11 MED ORDER — PEG 3350-KCL-NA BICARB-NACL 420 G PO SOLR
ORAL | 0 refills | Status: AC
  Filled 2024-02-11: qty 4000, 1d supply, fill #0

## 2024-02-11 MED ORDER — BISACODYL 5 MG PO TBEC
DELAYED_RELEASE_TABLET | ORAL | 0 refills | Status: AC
  Filled 2024-02-11: qty 4, 1d supply, fill #0

## 2024-03-16 ENCOUNTER — Other Ambulatory Visit (HOSPITAL_COMMUNITY): Payer: Self-pay

## 2024-03-16 MED ORDER — METRONIDAZOLE 500 MG PO TABS
500.0000 mg | ORAL_TABLET | Freq: Once | ORAL | 0 refills | Status: AC
  Filled 2024-03-16: qty 1, 1d supply, fill #0

## 2024-03-16 MED ORDER — AZITHROMYCIN 250 MG PO TABS
ORAL_TABLET | ORAL | 0 refills | Status: AC
Start: 2024-03-16 — End: ?
  Filled 2024-03-16: qty 6, 5d supply, fill #0

## 2024-04-25 ENCOUNTER — Other Ambulatory Visit (HOSPITAL_COMMUNITY): Payer: Self-pay

## 2024-04-25 ENCOUNTER — Other Ambulatory Visit: Payer: Self-pay

## 2024-04-28 ENCOUNTER — Other Ambulatory Visit (HOSPITAL_COMMUNITY): Payer: Self-pay

## 2024-05-01 ENCOUNTER — Other Ambulatory Visit: Payer: Self-pay
